# Patient Record
Sex: Female | Born: 1965 | Race: White | Hispanic: No | Smoking: Current every day smoker
Health system: Southern US, Community
[De-identification: ages and names within clinical notes are randomized; demographics above are authoritative.]

## PROBLEM LIST (undated history)

## (undated) DIAGNOSIS — T8859XA Other complications of anesthesia, initial encounter: Secondary | ICD-10-CM

## (undated) DIAGNOSIS — I10 Essential (primary) hypertension: Secondary | ICD-10-CM

## (undated) DIAGNOSIS — M1990 Unspecified osteoarthritis, unspecified site: Secondary | ICD-10-CM

## (undated) DIAGNOSIS — F909 Attention-deficit hyperactivity disorder, unspecified type: Secondary | ICD-10-CM

## (undated) DIAGNOSIS — T7840XA Allergy, unspecified, initial encounter: Secondary | ICD-10-CM

## (undated) DIAGNOSIS — R61 Generalized hyperhidrosis: Secondary | ICD-10-CM

## (undated) DIAGNOSIS — F439 Reaction to severe stress, unspecified: Secondary | ICD-10-CM

## (undated) DIAGNOSIS — F32A Depression, unspecified: Secondary | ICD-10-CM

## (undated) DIAGNOSIS — J189 Pneumonia, unspecified organism: Secondary | ICD-10-CM

## (undated) DIAGNOSIS — J40 Bronchitis, not specified as acute or chronic: Secondary | ICD-10-CM

## (undated) DIAGNOSIS — R0600 Dyspnea, unspecified: Secondary | ICD-10-CM

## (undated) DIAGNOSIS — J449 Chronic obstructive pulmonary disease, unspecified: Secondary | ICD-10-CM

## (undated) DIAGNOSIS — M549 Dorsalgia, unspecified: Secondary | ICD-10-CM

## (undated) DIAGNOSIS — G8929 Other chronic pain: Secondary | ICD-10-CM

## (undated) DIAGNOSIS — K219 Gastro-esophageal reflux disease without esophagitis: Secondary | ICD-10-CM

## (undated) DIAGNOSIS — F419 Anxiety disorder, unspecified: Secondary | ICD-10-CM

## (undated) HISTORY — DX: Allergy, unspecified, initial encounter: T78.40XA

## (undated) HISTORY — DX: Unspecified osteoarthritis, unspecified site: M19.90

## (undated) HISTORY — DX: Gastro-esophageal reflux disease without esophagitis: K21.9

## (undated) HISTORY — DX: Essential (primary) hypertension: I10

## (undated) HISTORY — DX: Pneumonia, unspecified organism: J18.9

## (undated) HISTORY — DX: Bronchitis, not specified as acute or chronic: J40

## (undated) HISTORY — DX: Reaction to severe stress, unspecified: F43.9

## (undated) HISTORY — DX: Generalized hyperhidrosis: R61

## (undated) HISTORY — PX: APPENDECTOMY: SHX54

## (undated) HISTORY — PX: ABDOMINAL HYSTERECTOMY: SHX81

---

## 1991-09-24 HISTORY — PX: OTHER SURGICAL HISTORY: SHX169

## 1998-03-01 ENCOUNTER — Ambulatory Visit (HOSPITAL_COMMUNITY): Admission: RE | Admit: 1998-03-01 | Discharge: 1998-03-01 | Payer: Self-pay | Admitting: Neurosurgery

## 1998-03-15 ENCOUNTER — Ambulatory Visit (HOSPITAL_COMMUNITY): Admission: RE | Admit: 1998-03-15 | Discharge: 1998-03-15 | Payer: Self-pay | Admitting: Neurosurgery

## 1998-03-29 ENCOUNTER — Ambulatory Visit (HOSPITAL_COMMUNITY): Admission: RE | Admit: 1998-03-29 | Discharge: 1998-03-29 | Payer: Self-pay | Admitting: Neurosurgery

## 2002-10-11 ENCOUNTER — Ambulatory Visit (HOSPITAL_COMMUNITY): Admission: RE | Admit: 2002-10-11 | Discharge: 2002-10-11 | Payer: Self-pay | Admitting: Family Medicine

## 2002-10-11 ENCOUNTER — Encounter: Payer: Self-pay | Admitting: Family Medicine

## 2003-07-03 ENCOUNTER — Emergency Department (HOSPITAL_COMMUNITY): Admission: EM | Admit: 2003-07-03 | Discharge: 2003-07-03 | Payer: Self-pay | Admitting: Emergency Medicine

## 2006-09-03 ENCOUNTER — Ambulatory Visit (HOSPITAL_COMMUNITY): Payer: Self-pay | Admitting: Psychology

## 2006-09-10 ENCOUNTER — Ambulatory Visit (HOSPITAL_COMMUNITY): Payer: Self-pay | Admitting: Psychology

## 2006-10-08 ENCOUNTER — Ambulatory Visit (HOSPITAL_COMMUNITY): Payer: Self-pay | Admitting: Psychology

## 2006-10-29 ENCOUNTER — Ambulatory Visit (HOSPITAL_COMMUNITY): Payer: Self-pay | Admitting: Psychology

## 2006-11-12 ENCOUNTER — Ambulatory Visit (HOSPITAL_COMMUNITY): Payer: Self-pay | Admitting: Psychology

## 2007-03-18 ENCOUNTER — Ambulatory Visit: Payer: Self-pay | Admitting: Cardiology

## 2007-04-14 ENCOUNTER — Ambulatory Visit (HOSPITAL_COMMUNITY): Payer: Self-pay | Admitting: Psychiatry

## 2007-05-05 ENCOUNTER — Ambulatory Visit (HOSPITAL_COMMUNITY): Payer: Self-pay | Admitting: Psychiatry

## 2007-07-30 ENCOUNTER — Ambulatory Visit (HOSPITAL_COMMUNITY): Payer: Self-pay | Admitting: Psychiatry

## 2007-09-14 ENCOUNTER — Other Ambulatory Visit: Admission: RE | Admit: 2007-09-14 | Discharge: 2007-09-14 | Payer: Self-pay | Admitting: Obstetrics and Gynecology

## 2007-09-29 ENCOUNTER — Ambulatory Visit (HOSPITAL_COMMUNITY): Payer: Self-pay | Admitting: Psychiatry

## 2007-11-26 ENCOUNTER — Ambulatory Visit (HOSPITAL_COMMUNITY): Payer: Self-pay | Admitting: Psychiatry

## 2008-05-11 ENCOUNTER — Encounter: Payer: Self-pay | Admitting: Obstetrics & Gynecology

## 2008-05-11 ENCOUNTER — Ambulatory Visit (HOSPITAL_COMMUNITY): Admission: RE | Admit: 2008-05-11 | Discharge: 2008-05-12 | Payer: Self-pay | Admitting: Obstetrics & Gynecology

## 2009-08-30 ENCOUNTER — Ambulatory Visit (HOSPITAL_COMMUNITY): Admission: RE | Admit: 2009-08-30 | Discharge: 2009-08-30 | Payer: Self-pay | Admitting: Obstetrics & Gynecology

## 2010-12-15 ENCOUNTER — Emergency Department (HOSPITAL_COMMUNITY): Payer: 59

## 2010-12-15 ENCOUNTER — Other Ambulatory Visit: Payer: Self-pay | Admitting: General Surgery

## 2010-12-15 ENCOUNTER — Observation Stay (HOSPITAL_COMMUNITY)
Admission: EM | Admit: 2010-12-15 | Discharge: 2010-12-16 | Disposition: A | Discharge status: Home / Self Care | Payer: 59 | Attending: General Surgery | Admitting: General Surgery

## 2010-12-15 DIAGNOSIS — R1031 Right lower quadrant pain: Secondary | ICD-10-CM | POA: Insufficient documentation

## 2010-12-15 DIAGNOSIS — K3580 Unspecified acute appendicitis: Principal | ICD-10-CM | POA: Insufficient documentation

## 2010-12-15 DIAGNOSIS — Z01812 Encounter for preprocedural laboratory examination: Secondary | ICD-10-CM | POA: Insufficient documentation

## 2010-12-15 DIAGNOSIS — F411 Generalized anxiety disorder: Secondary | ICD-10-CM | POA: Insufficient documentation

## 2010-12-15 DIAGNOSIS — F988 Other specified behavioral and emotional disorders with onset usually occurring in childhood and adolescence: Secondary | ICD-10-CM | POA: Insufficient documentation

## 2010-12-15 LAB — DIFFERENTIAL
Basophils Absolute: 0 10*3/uL (ref 0.0–0.1)
Basophils Relative: 0 % (ref 0–1)
Eosinophils Absolute: 0.1 10*3/uL (ref 0.0–0.7)
Eosinophils Relative: 1 % (ref 0–5)
Lymphocytes Relative: 27 % (ref 12–46)
Lymphs Abs: 2.1 10*3/uL (ref 0.7–4.0)
Monocytes Absolute: 0.4 10*3/uL (ref 0.1–1.0)
Monocytes Relative: 5 % (ref 3–12)
Neutro Abs: 5.1 10*3/uL (ref 1.7–7.7)
Neutrophils Relative %: 66 % (ref 43–77)

## 2010-12-15 LAB — CBC
HCT: 39.2 % (ref 36.0–46.0)
Hemoglobin: 12.9 g/dL (ref 12.0–15.0)
MCH: 30.6 pg (ref 26.0–34.0)
MCHC: 32.9 g/dL (ref 30.0–36.0)
MCV: 93.1 fL (ref 78.0–100.0)
Platelets: 150 10*3/uL (ref 150–400)
RBC: 4.21 MIL/uL (ref 3.87–5.11)
RDW: 12.9 % (ref 11.5–15.5)
WBC: 7.6 10*3/uL (ref 4.0–10.5)

## 2010-12-15 LAB — URINE MICROSCOPIC-ADD ON

## 2010-12-15 LAB — BASIC METABOLIC PANEL
BUN: 7 mg/dL (ref 6–23)
CO2: 23 mEq/L (ref 19–32)
Calcium: 9.8 mg/dL (ref 8.4–10.5)
Chloride: 106 mEq/L (ref 96–112)
Creatinine, Ser: 0.54 mg/dL (ref 0.4–1.2)
GFR calc Af Amer: >60 mL/min (ref >60)
GFR calc non Af Amer: >60 mL/min (ref >60)
Glucose, Bld: 86 mg/dL (ref 70–99)
Potassium: 3.5 mEq/L (ref 3.5–5.1)
Sodium: 141 mEq/L (ref 135–145)

## 2010-12-15 LAB — URINALYSIS, ROUTINE W REFLEX MICROSCOPIC
Glucose, UA: NEGATIVE mg/dL
Leukocytes, UA: NEGATIVE
Nitrite: POSITIVE — AB
Specific Gravity, Urine: 1.016 (ref 1.005–1.030)
pH: 7.5 (ref 5.0–8.0)

## 2010-12-15 LAB — LIPASE, BLOOD: Lipase: 33 U/L (ref 11–59)

## 2010-12-15 MED ORDER — IOHEXOL 300 MG/ML  SOLN
100.0000 mL | Freq: Once | INTRAMUSCULAR | Status: AC | PRN
  Administered 2010-12-15: 100 mL via INTRAVENOUS

## 2010-12-18 LAB — URINE CULTURE

## 2010-12-20 NOTE — H&P (Signed)
  NAMEALLANA, Alicia Fritz            ACCOUNT NO.:  [id number]  MEDICAL RECORD NO.:  [id number]           PATIENT TYPE:  O  LOCATION:  5127                         FACILITY:  [facility]  PHYSICIAN:  Gabrielle Dare. Janee Morn, M.D.DATE OF BIRTH:  [birth year]  DATE OF ADMISSION:  12/15/2010 DATE OF DISCHARGE:                             HISTORY & PHYSICAL   CHIEF COMPLAINT:  Right lower quadrant abdominal pain and nausea.  HISTORY OF PRESENT ILLNESS:  Ms. Alicia Fritz is a 45 year old white female, who developed acute onset of nausea around noon time yesterday, which quickly developed into right lower quadrant abdominal pain, but has persisted since then and it has been getting a little bit worse.  She came to the emergency department for further evaluation.  PAST MEDICAL HISTORY:  Includes anxiety disorder and hypertension.  PAST SURGICAL HISTORY:  Includes vaginal hysterectomy and foot surgery.  SOCIAL HISTORY:  She currently smokes cigarettes.  She does not drink alcohol.  She works as a Interior and spatial designer.  ALLERGIES:  PENICILLIN causing anxiety and hallucinations and SULFA.  MEDICATIONS:  Include: 1. Adderall 30 mg daily. 2. Cymbalta 60 mg daily. 3. Diovan 80 mg daily. 4. Xanax 0.5 mg daily. 5. Vicodin as needed.  REVIEW OF SYSTEMS:  GI:  Complaints as above.  NEUROPSYCH:  She has ADD and some anxiety disorder.  CARDIOVASCULAR:  She has some intermittent lower extremity swelling.  Remainder of the system review was unrevealing.  PHYSICAL EXAMINATION:  VITAL SIGNS:  Temperature 98.2, pulse 76, respirations 16, blood pressure 138/79. GENERAL:  She is awake and alert. HEENT:  She is normocephalic and atraumatic. NECK:  Supple with no tenderness. LUNGS:  Clear to auscultation. HEART:  Regular with no murmurs.  Impulses palpable in the left chest. Distal pulses are 2+.  She does have some mild peripheral edema in the lower extremities. ABDOMEN:  Soft but tender in the right lower quadrant with no  guarding. Bowel sounds are hypoactive.  No masses are felt.  No organomegaly is noted. EXTREMITIES:  As above. SKIN:  Warm and dry.  LABORATORY STUDIES:  White blood cell count 7600, hemoglobin 12.9. Basic metabolic panel is within normal limits.  CT scan of the abdomen and pelvis is consistent with early acute appendicitis without evidence of perforation.  IMPRESSION:  Acute appendicitis.  PLAN:  We will take her to the operating room for laparoscopic appendectomy.  Procedure, risk and benefits were discussed in detail with the patient and her husband.  Questions were answered, and she is agreeable.  We will give her IV antibiotics at this time.     Gabrielle Dare Janee Morn, M.D.     BET/MEDQ  D:  12/15/2010  T:  12/16/2010  Job:  [id number]  Electronically Signed by Violeta Gelinas M.D. on 12/20/2010 01:42:01 PM

## 2010-12-20 NOTE — Op Note (Signed)
NAMEJAMECA, Alicia Fritz            ACCOUNT NO.:  [id number]  MEDICAL RECORD NO.:  [id number]           PATIENT TYPE:  O  LOCATION:  5127                         FACILITY:  [facility]  PHYSICIAN:  Gabrielle Dare. Janee Morn, M.D.DATE OF BIRTH:  [birth year]  DATE OF PROCEDURE:  12/15/2010 DATE OF DISCHARGE:                              OPERATIVE REPORT   PREOPERATIVE DIAGNOSIS:  Acute appendicitis.  POSTOPERATIVE DIAGNOSIS:  Acute appendicitis.  PROCEDURE:  Laparoscopic appendectomy.  SURGEON:  Gabrielle Dare. Janee Morn, MD  ANESTHESIA:  General endotracheal.  HISTORY OF PRESENT ILLNESS:  Alicia Fritz is a 45 year old female who presented to the emergency department with right lower quadrant abdominal pain and nausea.  History, physical exam and workup are all consistent with acute appendicitis.  She is brought to operating room for laparoscopic appendectomy.  PROCEDURE IN DETAIL:  Informed consent was obtained.  The patient received intravenous antibiotics on call to the operating room.  She was identified in the preop holding area.  She was brought to the operating room.  General endotracheal anesthesia was administered by the anesthesia staff.  Her abdomen was prepped and draped in a sterile fashion after placement of Foley catheter by nursing staff.  There was a time-out procedure done.  Infraumbilical region was infiltrated with 0.25% Marcaine with epinephrine.  Infraumbilical incision was made. Subcutaneous tissues were dissected down revealing the anterior fascia. This was divided sharply along the midline and the peritoneal cavities entered under direct vision without difficulty.  A 0 Vicryl purse-string suture was placed on the fascial opening.  The Hasson trocar was then inserted into the abdomen and the abdomen was insufflated with carbon dioxide in standard fashion.  Under direct vision, a 12-mm left lower quadrant and a 5-mm right midabdomen port were placed.  Local anesthetic was used  at both port sites as well.  Laparoscopic exploration revealed inflamed but not perforated appendix.  Mesoappendix was divided with the Harmonic scalpel achieving excellent hemostasis.  This cleared things off down to the base which was not acutely inflamed.  Base of the appendix was then divided with an endoscopic GIA stapler with a vascular load.  This left excellent staple line.  Appendix was placed in an EndoCatch bag and removed from the abdomen via the left lower quadrant port site.  The abdomen was copiously irrigated.  The irrigation fluid returned clear.  There was no bleeding.  The staple line was intact on the cecum.  The remainder of the irrigation fluid was evacuated and the ports were removed under direct vision.  Pneumoperitoneum was released. The infraumbilical fascia was closed by tying the 0 Vicryl purse-string suture with care not to trap the intraabdominal content.  All three wounds were copiously irrigated and the skin of each was closed with running 4-0 Vicryl subcuticular stitch followed by Dermabond.  Sponge, needle and instrument counts were correct.  The patient tolerated the procedure well without apparent complications and was taken to the recovery room in stable condition.     Gabrielle Dare Janee Morn, M.D.     BET/MEDQ  D:  12/15/2010  T:  12/16/2010  Job:  [id number]  Electronically Signed by  Violeta Gelinas M.D. on 12/20/2010 01:42:05 PM

## 2011-01-03 NOTE — Discharge Summary (Signed)
  [name], Fritz            ACCOUNT NO.:  [id number]  MEDICAL RECORD NO.:  [id number]           PATIENT TYPE:  O  LOCATION:  5127                         FACILITY:  [facility]  PHYSICIAN:  Gabrielle Dare. Janee Morn, M.D.DATE OF BIRTH:  June 30, 1966  DATE OF ADMISSION:  12/15/2010 DATE OF DISCHARGE:  12/16/2010                              DISCHARGE SUMMARY   ADMISSION DIAGNOSES: 1. Acute appendicitis. 2. Hypertension. 3. Anxiety disorder. 4. Tobacco use.  DISCHARGE DIAGNOSES: 1. Acute appendicitis. 2. Hypertension. 3. Anxiety disorder. 4. Tobacco use.  PROCEDURE:  Laparoscopic appendectomy, December 15, 2010.  BRIEF HISTORY:  The patient is a 44-year white female who developed acute nausea around noon yesterday and then developed into right lower quadrant abdominal pain.  This had been persisted and become aggressively worse.  She was seen in the emergency room where white count was normal at 6000.  CT of the abdomen and pelvis was consistent with early acute appendicitis without evidence of perforation.  She was seen in consultation by Dr. Violeta Gelinas and admitted, taken directly to the OR for laparoscopic appendectomy.  PAST MEDICAL HISTORY:  Includes anxiety disorder and hypertension.  PAST SURGICAL HISTORY:  Includes vaginal hysterectomy and foot surgery.  She is currently a tobacco smoker.  ALLERGIES:  PENICILLIN causes anxiety and hallucinations, similar reaction to SULFA.  MEDICATIONS: 1. Adderall 30 mg daily. 2. Cymbalta 60 mg daily. 3. Diovan 80 mg daily. 4. Xanax 0.5 daily. 5. Vicodin p.r.n.  For further history and physical, please see the history and physical.  HOSPITAL COURSE:  The patient was admitted, taken to the OR, and underwent laparoscopic appendectomy.  She was returned to the floor in satisfactory condition.  The following morning, she was taking p.o.'s without difficulty.  She ambulated, positive for flatus, positive for bowel sounds, wound  looked good, and the patient was anxious for discharge.  She was subsequently discharged home that morning on her home meds and Percocet 1-2 p.o. q.4 hours p.r.n.  She was scheduled for followup on January 08, 2011, at 1:30 p.m. in the [facility]  CONDITION ON DISCHARGE:  Improved.     Eber Hong, P.A.   ______________________________ Gabrielle Dare. Janee Morn, M.D.    WDJ/MEDQ  D:  12/22/2010  T:  12/23/2010  Job:  [id number]  Electronically Signed by Sherrie George P.A. on 12/31/2010 81:19:14 PM Electronically Signed by Violeta Gelinas M.D. on 01/03/2011 04:14:29 PM

## 2011-02-05 NOTE — Op Note (Signed)
[name], Alicia Fritz            ACCOUNT NO.:  [id number]   MEDICAL RECORD NO.:  [id number]          PATIENT TYPE:  AMB   LOCATION:  DAY                           FACILITY:  [facility]   PHYSICIAN:  Lazaro Arms, M.D.   DATE OF BIRTH:  1966/05/05   DATE OF PROCEDURE:  05/11/2008  DATE OF DISCHARGE:                               OPERATIVE REPORT   PREOPERATIVE DIAGNOSES:  1. Menometrorrhagia.  2. Dysmenorrhea.  3. Midline pelvic pain.  4. Dyspareunia.   POSTOPERATIVE DIAGNOSES:  1. Menometrorrhagia.  2. Dysmenorrhea.  3. Midline pelvic pain.  4. Dyspareunia.   PROCEDURE:  Vaginal hysterectomy.   SURGEON:  Lazaro Arms, MD   ANESTHESIA:  General endotracheal.   FINDINGS:  The patient had a normal uterus, tubes, and ovaries.  There  were no pelvic abnormalities that I could see.   DESCRIPTION OF OPERATION:  The patient was taken to the operating room,  placed in supine position where she underwent general endotracheal  anesthesia  She was then placed in dorsal lithotomy position in candy  cane stirrups, and prepped and draped in usual sterile fashion. A  weighted speculum was placed. Her cervix was grasped with thyroid  tenaculum.  A 0.5% Marcaine with 1:20,000 epinephrine was injected in  the cervix in a circumferential fashion.  Electrocautery unit was used  and the vagina was incised.  The anterior and posterior vagina were  pushed off the lower uterine segment.  The posterior cul-de-sac was  entered.  Uterosacral ligaments were clamped, cut, and suture-ligated.  The cardinal ligaments were clamped, cut, and suture-ligated.  The  anterior cul-de-sac was entered.  The uterine vessels were clamped  anteriorly and posteriorly . The broad ligaments were plicated and  clamped cut and suture-ligated.  Serial pedicles were taken up the  fundus.  Each pedicle being clamped, cut, and suture-ligated.  The utero-  ovarian ligaments were cross-clamped.  Suture ligation was  performed  bilaterally with good hemostasis.  Peritoneum was closed.  The vagina was closed side-to-side with  interrupted sutures.  The patient tolerated the procedure well.  She  experienced 100 mL of blood loss and taken to recovery room in good  condition.  All counts were correct x3.      Lazaro Arms, M.D.  Electronically Signed     LHE/MEDQ  D:  05/11/2008  T:  05/12/2008  Job:  [id number]

## 2011-02-28 ENCOUNTER — Encounter (INDEPENDENT_AMBULATORY_CARE_PROVIDER_SITE_OTHER): Payer: Self-pay | Admitting: General Surgery

## 2011-06-21 LAB — BASIC METABOLIC PANEL
CO2: 22
Calcium: 9.5
GFR calc Af Amer: >60
GFR calc non Af Amer: >60
Glucose, Bld: 119 — ABNORMAL HIGH
Potassium: 3.7
Sodium: 137

## 2011-06-21 LAB — CBC
HCT: 36.9
Hemoglobin: 12.4
WBC: 6.1

## 2011-06-21 LAB — URINALYSIS, ROUTINE W REFLEX MICROSCOPIC
Bilirubin Urine: NEGATIVE
Ketones, ur: NEGATIVE
Nitrite: NEGATIVE
Specific Gravity, Urine: 1.025
Urobilinogen, UA: 0.2
pH: 6

## 2011-06-21 LAB — TYPE AND SCREEN: Antibody Screen: NEGATIVE

## 2012-04-20 IMAGING — CT CT ABD-PELV W/ CM
2 of 6 series · 17 of 46 positions shown, 19 images · IV contrast (CONTRAST)
Comparison: None.

CLINICAL DATA: Right lower quadrant pain, nausea, chills.

CT ABDOMEN AND PELVIS WITH CONTRAST
TECHNIQUE: Multidetector CT imaging of the abdomen and pelvis was
performed following the standard protocol during bolus
administration of intravenous contrast.
Contrast: 100 ml Omnipaque 300 IV.

[Series 2: routine · axial · 0.69mm/px · z∈[-462,-32]mm · 14 of 98 slices shown, 16 images]
[im 6/98  soft-tissue]
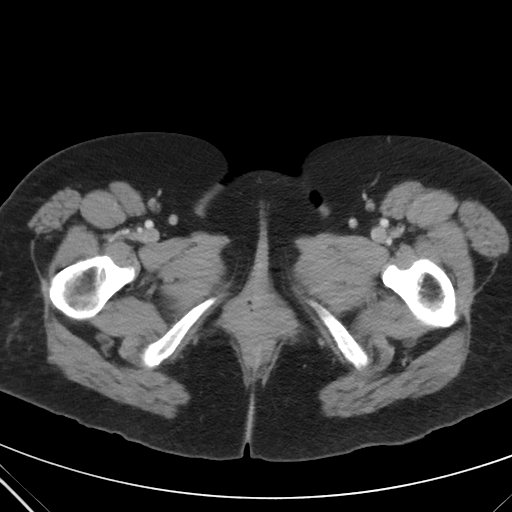
[im 6/98  bone]
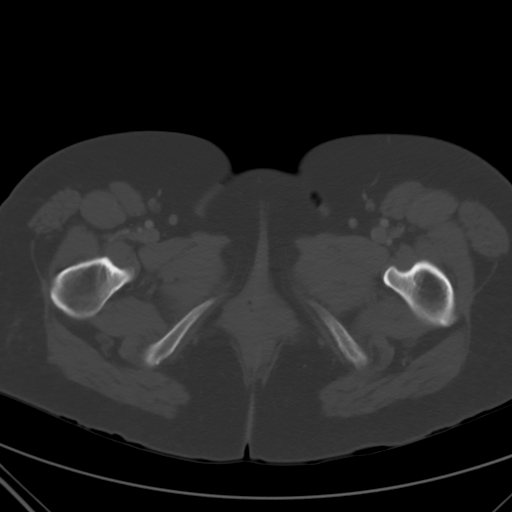
[im 12/98  soft-tissue]
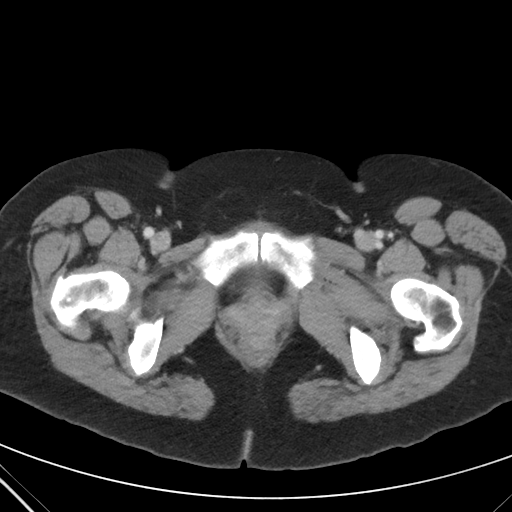
[im 18/98  soft-tissue]
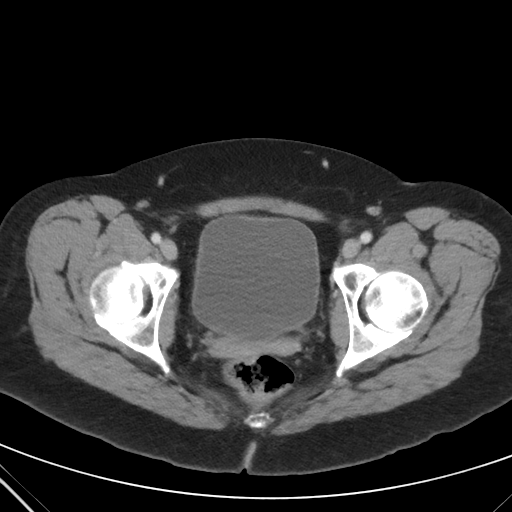
[im 29/98  soft-tissue]
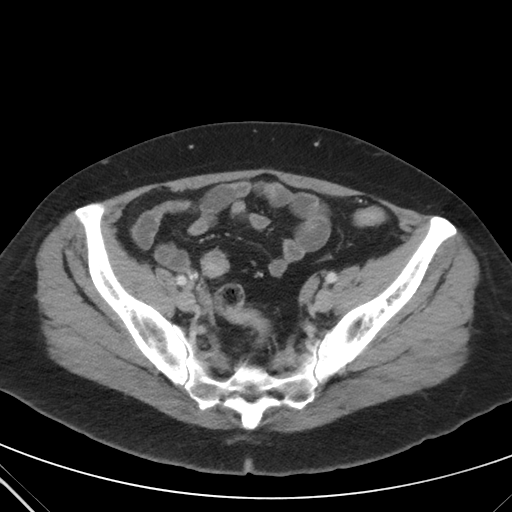
[im 35/98  soft-tissue]
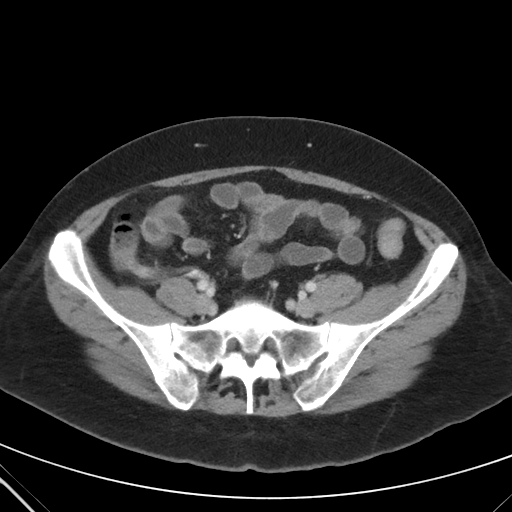
[im 40/98  soft-tissue]
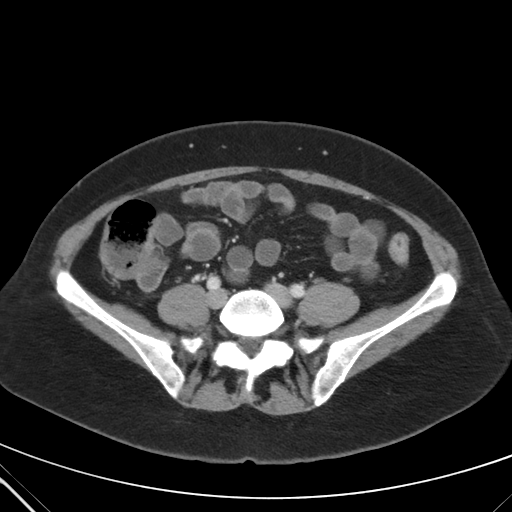
[im 46/98  soft-tissue]
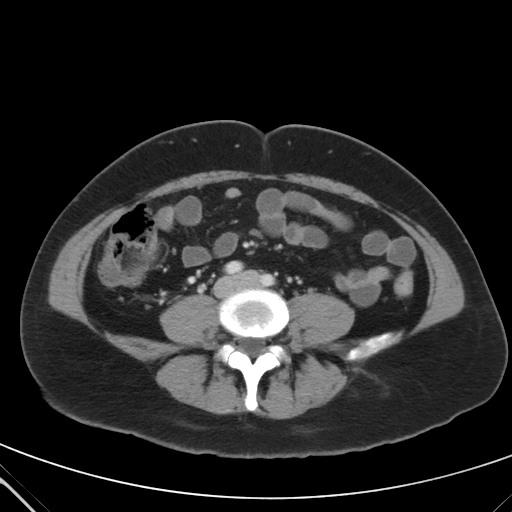
[im 52/98  soft-tissue]
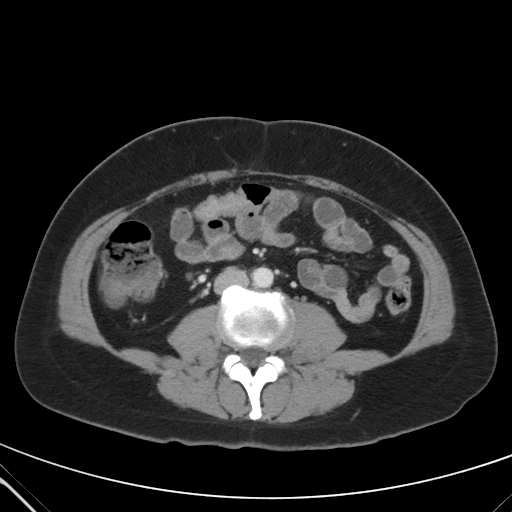
[im 58/98  soft-tissue]
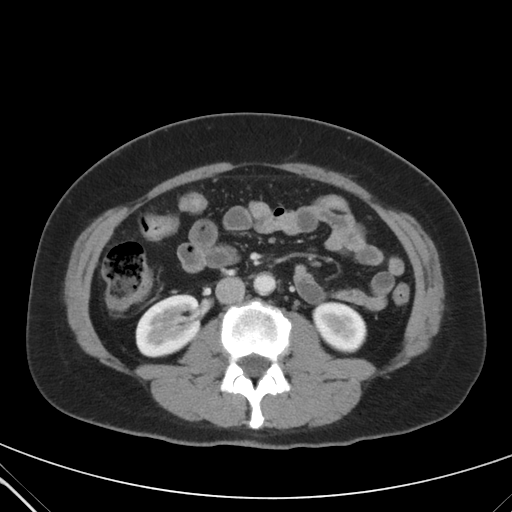
[im 58/98  bone]
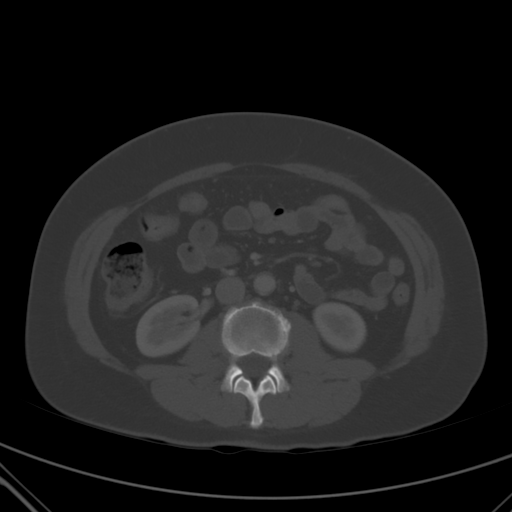
[im 63/98  soft-tissue]
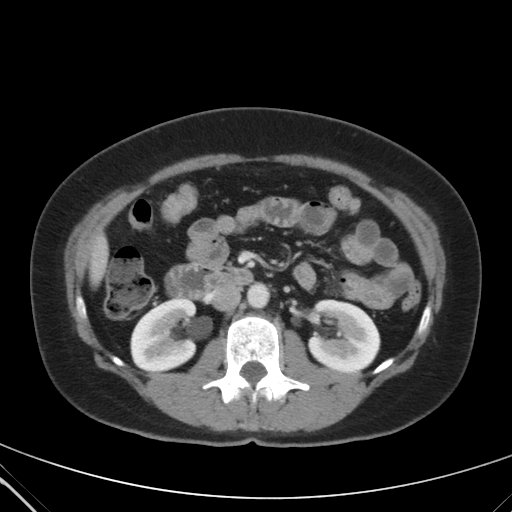
[im 75/98  soft-tissue]
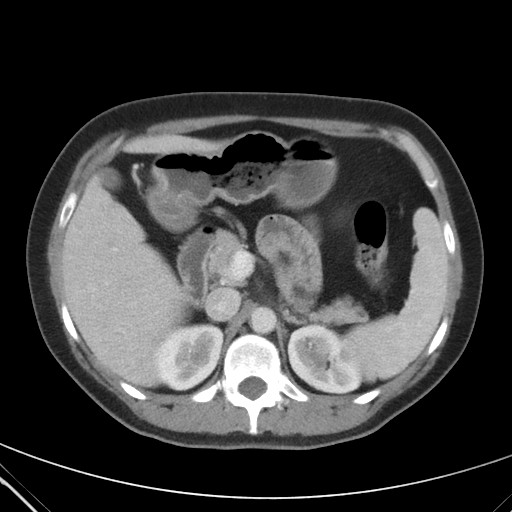
[im 80/98  soft-tissue]
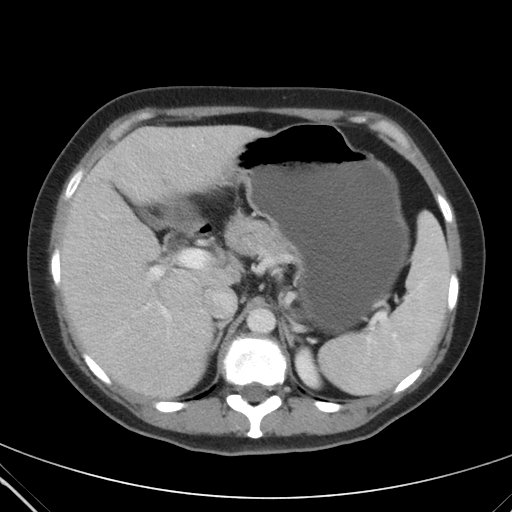
[im 86/98  soft-tissue]
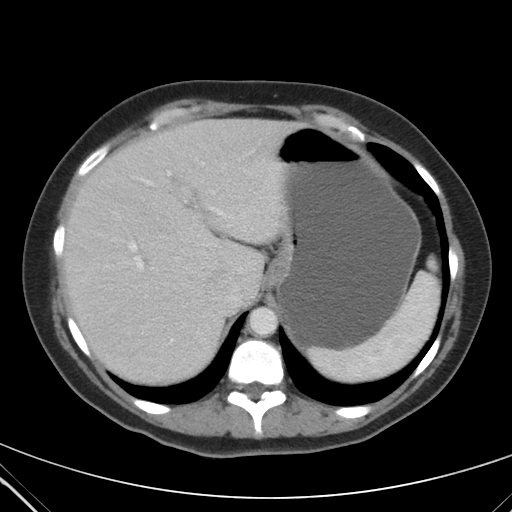
[im 92/98  soft-tissue]
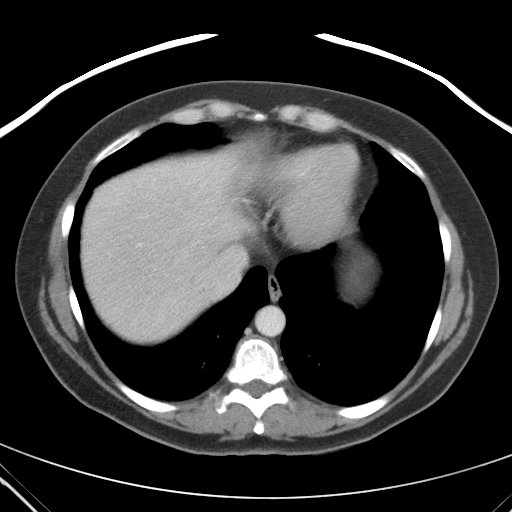

[cor · coronal · 0.95mm/px · 3 of 123 slices shown]
[im 41/123  soft-tissue]
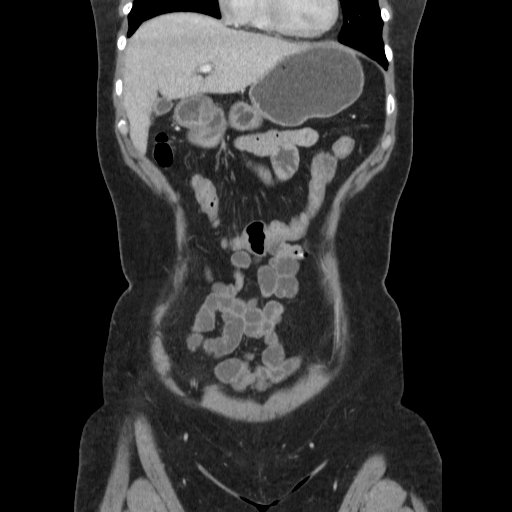
[im 55/123  soft-tissue]
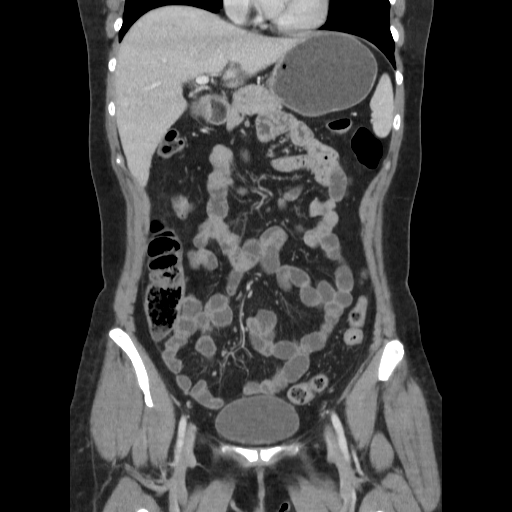
[im 68/123  soft-tissue]
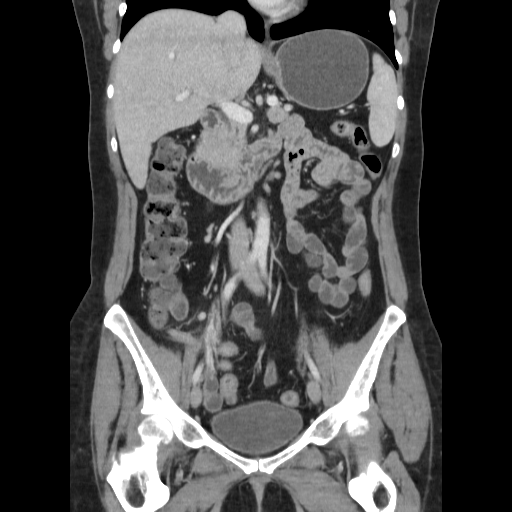

[17 of 46 positions shown; findings below may reference images not displayed]

FINDINGS: Minimal dependent atelectasis in the lung bases.  No
effusions.  Heart is normal size.

Liver, spleen, pancreas, adrenals and kidneys are normal.
Gallbladder is contracted.

Appendix is mildly dilated, measuring up to 9 mm.  Slight
surrounding inflammatory change.  Findings compatible with early
appendicitis.

Large and small bowel are unremarkable.  No free fluid, free air or
adenopathy.  Prior hysterectomy.  No adnexal masses.  Urinary
bladder is unremarkable.  No acute bony abnormality.
IMPRESSION: Early acute appendicitis changes.

## 2013-02-26 ENCOUNTER — Encounter: Payer: Self-pay | Admitting: *Deleted

## 2013-03-01 ENCOUNTER — Ambulatory Visit (INDEPENDENT_AMBULATORY_CARE_PROVIDER_SITE_OTHER): Payer: 59 | Admitting: Obstetrics & Gynecology

## 2013-03-01 ENCOUNTER — Encounter: Payer: Self-pay | Admitting: Obstetrics & Gynecology

## 2013-03-01 VITALS — BP 130/70 | Ht 65.0 in | Wt 147.0 lb

## 2013-03-01 DIAGNOSIS — Z01419 Encounter for gynecological examination (general) (routine) without abnormal findings: Secondary | ICD-10-CM

## 2013-03-01 NOTE — Progress Notes (Signed)
Patient ID: Alicia Fritz, female   DOB: 08-Apr-1966, 47 y.o.   MRN: [id number] Subjective:     Alicia Fritz is a 47 y.o. female here for a routine exam.  No LMP recorded. Status post hysterectomy.  No obstetric history on file. Current complaints: none.  Personal health questionnaire reviewed: no.   Gynecologic History No LMP recorded. Contraception: status post hysterectomy Last Pap: na. Results were: na Last mammogram: 2010. Results were: normal  Obstetric History OB History   Grav Para Term Preterm Abortions TAB SAB Ect Mult Living                   The following portions of the patient's history were reviewed and updated as appropriate: allergies, current medications, past family history, past medical history, past social history, past surgical history and problem list.  Review of Systems  Review of Systems  Constitutional: Negative for fever, chills, weight loss, malaise/fatigue and diaphoresis.  HENT: Negative for hearing loss, ear pain, nosebleeds, congestion, sore throat, neck pain, tinnitus and ear discharge.   Eyes: Negative for blurred vision, double vision, photophobia, pain, discharge and redness.  Respiratory: Negative for cough, hemoptysis, sputum production, shortness of breath, wheezing and stridor.   Cardiovascular: Negative for chest pain, palpitations, orthopnea, claudication, leg swelling and PND.  Gastrointestinal: negative for abdominal pain. Negative for heartburn, nausea, vomiting, diarrhea, constipation, blood in stool and melena.  Genitourinary: Negative for dysuria, urgency, frequency, hematuria and flank pain.  Musculoskeletal: Negative for myalgias, back pain, joint pain and falls.  Skin: Negative for itching and rash.  Neurological: Negative for dizziness, tingling, tremors, sensory change, speech change, focal weakness, seizures, loss of consciousness, weakness and headaches.  Endo/Heme/Allergies: Negative for environmental allergies and  polydipsia. Does not bruise/bleed easily.  Psychiatric/Behavioral: Negative for depression, suicidal ideas, hallucinations, memory loss and substance abuse. The patient is not nervous/anxious and does not have insomnia.        Objective:    Physical Exam  Vitals reviewed. Constitutional: She is oriented to person, place, and time. She appears well-developed and well-nourished.  HENT:  Head: Normocephalic and atraumatic.        Right Ear: External ear normal.  Left Ear: External ear normal.  Nose: Nose normal.  Mouth/Throat: Oropharynx is clear and moist.  Eyes: Conjunctivae and EOM are normal. Pupils are equal, round, and reactive to light. Right eye exhibits no discharge. Left eye exhibits no discharge. No scleral icterus.  Neck: Normal range of motion. Neck supple. No tracheal deviation present. No thyromegaly present.  Cardiovascular: Normal rate, regular rhythm, normal heart sounds and intact distal pulses.  Exam reveals no gallop and no friction rub.   No murmur heard. Respiratory: Effort normal and breath sounds normal. No respiratory distress. She has no wheezes. She has no rales. She exhibits no tenderness.  GI: Soft. Bowel sounds are normal. She exhibits no distension and no mass. There is no tenderness. There is no rebound and no guarding.  Genitourinary:       Vulva is normal without lesions Vagina is pink moist without discharge Cervix surgically absent Uterus surgically absent Adnexa is negative with normal sized ovaries   Musculoskeletal: Normal range of motion. She exhibits no edema and no tenderness.  Neurological: She is alert and oriented to person, place, and time. She has normal reflexes. She displays normal reflexes. No cranial nerve deficit. She exhibits normal muscle tone. Coordination normal.  Skin: Skin is warm and dry. No rash noted. No erythema. No  pallor.  Psychiatric: She has a normal mood and affect. Her behavior is normal. Judgment and thought content  normal.       Assessment:    Healthy female exam.    Plan:    Mammogram ordered. Follow up in: 1 year.

## 2013-11-23 ENCOUNTER — Encounter (INDEPENDENT_AMBULATORY_CARE_PROVIDER_SITE_OTHER): Payer: Self-pay | Admitting: *Deleted

## 2013-11-24 ENCOUNTER — Encounter (INDEPENDENT_AMBULATORY_CARE_PROVIDER_SITE_OTHER): Payer: Self-pay

## 2013-12-23 ENCOUNTER — Telehealth (INDEPENDENT_AMBULATORY_CARE_PROVIDER_SITE_OTHER): Payer: Self-pay | Admitting: *Deleted

## 2013-12-23 ENCOUNTER — Other Ambulatory Visit (INDEPENDENT_AMBULATORY_CARE_PROVIDER_SITE_OTHER): Payer: Self-pay | Admitting: *Deleted

## 2013-12-23 DIAGNOSIS — Z800 Family history of malignant neoplasm of digestive organs: Secondary | ICD-10-CM

## 2013-12-23 DIAGNOSIS — Z1211 Encounter for screening for malignant neoplasm of colon: Secondary | ICD-10-CM

## 2013-12-23 NOTE — Telephone Encounter (Signed)
Patient needs movi prep 

## 2013-12-27 MED ORDER — PEG-KCL-NACL-NASULF-NA ASC-C 100 G PO SOLR: 1.0000 | Freq: Once | ORAL | Status: DC

## 2014-02-08 ENCOUNTER — Telehealth (INDEPENDENT_AMBULATORY_CARE_PROVIDER_SITE_OTHER): Payer: Self-pay | Admitting: *Deleted

## 2014-02-08 NOTE — Telephone Encounter (Signed)
  Procedure: tcs  Reason/Indication:  fam hx colon ca  Has patient had this procedure before?  Yes, 2010 -- scanned  If so, when, by whom and where?    Is there a family history of colon cancer?  Yes, father & mother  Who?  What age when diagnosed?    Is patient diabetic?   no      Does patient have prosthetic heart valve?  no  Do you have a pacemaker?  no  Has patient ever had endocarditis? no  Has patient had joint replacement within last 12 months?  no  Does patient tend to be constipated or take laxatives? no  Is patient on Coumadin, Plavix and/or Aspirin? no  Medications: ibuprofen prn, diovan 160 mg daily, cymbalta 60 mg daily, adderall 30 mg daily, xanax 0.5 mg daily, lortab 5/325 mg daily  Allergies: sulfur, pcn  Medication Adjustment:   Procedure date & time: 03/02/14 at 930

## 2014-02-08 NOTE — Telephone Encounter (Signed)
agree

## 2014-02-09 ENCOUNTER — Encounter (HOSPITAL_COMMUNITY): Payer: Self-pay | Admitting: Pharmacy Technician

## 2014-03-02 ENCOUNTER — Encounter (HOSPITAL_COMMUNITY): Payer: Self-pay | Admitting: *Deleted

## 2014-03-02 ENCOUNTER — Encounter (HOSPITAL_COMMUNITY)
Admission: RE | Disposition: A | Discharge status: Home / Self Care | Payer: Self-pay | Source: Ambulatory Visit | Attending: Internal Medicine

## 2014-03-02 ENCOUNTER — Ambulatory Visit (HOSPITAL_COMMUNITY)
Admission: RE | Admit: 2014-03-02 | Discharge: 2014-03-02 | Disposition: A | Discharge status: Home / Self Care | Payer: 59 | Source: Ambulatory Visit | Attending: Internal Medicine | Admitting: Internal Medicine

## 2014-03-02 DIAGNOSIS — Z1211 Encounter for screening for malignant neoplasm of colon: Secondary | ICD-10-CM

## 2014-03-02 DIAGNOSIS — Z800 Family history of malignant neoplasm of digestive organs: Secondary | ICD-10-CM | POA: Insufficient documentation

## 2014-03-02 DIAGNOSIS — M129 Arthropathy, unspecified: Secondary | ICD-10-CM | POA: Insufficient documentation

## 2014-03-02 DIAGNOSIS — I10 Essential (primary) hypertension: Secondary | ICD-10-CM | POA: Insufficient documentation

## 2014-03-02 DIAGNOSIS — D126 Benign neoplasm of colon, unspecified: Secondary | ICD-10-CM | POA: Insufficient documentation

## 2014-03-02 DIAGNOSIS — K644 Residual hemorrhoidal skin tags: Secondary | ICD-10-CM | POA: Insufficient documentation

## 2014-03-02 DIAGNOSIS — Z79899 Other long term (current) drug therapy: Secondary | ICD-10-CM | POA: Insufficient documentation

## 2014-03-02 DIAGNOSIS — F17200 Nicotine dependence, unspecified, uncomplicated: Secondary | ICD-10-CM | POA: Insufficient documentation

## 2014-03-02 DIAGNOSIS — M545 Low back pain, unspecified: Secondary | ICD-10-CM | POA: Insufficient documentation

## 2014-03-02 HISTORY — PX: COLONOSCOPY: SHX5424

## 2014-03-02 HISTORY — DX: Dorsalgia, unspecified: M54.9

## 2014-03-02 HISTORY — DX: Other chronic pain: G89.29

## 2014-03-02 SURGERY — COLONOSCOPY
Anesthesia: Moderate Sedation

## 2014-03-02 MED ORDER — MEPERIDINE HCL 50 MG/ML IJ SOLN
INTRAMUSCULAR | Status: DC | PRN
  Administered 2014-03-02 (×2): 25 mg via INTRAVENOUS

## 2014-03-02 MED ORDER — MIDAZOLAM HCL 5 MG/ML IJ SOLN: 2.0000 mg | Freq: Once | INTRAMUSCULAR | Status: DC

## 2014-03-02 MED ORDER — MIDAZOLAM HCL 2 MG/2ML IJ SOLN
INTRAMUSCULAR | Status: AC
  Filled 2014-03-02: qty 2

## 2014-03-02 MED ORDER — MIDAZOLAM HCL 5 MG/5ML IJ SOLN
INTRAMUSCULAR | Status: DC | PRN
  Administered 2014-03-02: 3 mg via INTRAVENOUS
  Administered 2014-03-02 (×2): 2 mg via INTRAVENOUS
  Administered 2014-03-02: 1 mg via INTRAVENOUS
  Administered 2014-03-02: 2 mg via INTRAVENOUS

## 2014-03-02 MED ORDER — MEPERIDINE HCL 50 MG/ML IJ SOLN
INTRAMUSCULAR | Status: AC
  Filled 2014-03-02: qty 1

## 2014-03-02 MED ORDER — MIDAZOLAM HCL 5 MG/5ML IJ SOLN
INTRAMUSCULAR | Status: AC
  Administered 2014-03-02: 2 mg
  Filled 2014-03-02: qty 10

## 2014-03-02 MED ORDER — STERILE WATER FOR IRRIGATION IR SOLN
Status: DC | PRN
  Administered 2014-03-02: 09:00:00

## 2014-03-02 MED ORDER — SODIUM CHLORIDE 0.9 % IV SOLN
INTRAVENOUS | Status: DC
  Administered 2014-03-02: 09:00:00 via INTRAVENOUS

## 2014-03-02 NOTE — H&P (Signed)
Alicia Fritz is an 48 y.o. female.   Chief Complaint: Patient is here for colonoscopy. HPI: Patient is 48 year old Caucasian female who is here for a screening colonoscopy. Patient's last exam was in March 22 and was normal. Family history significant for colon carcinoma in her father who was 80 at the time of diagnosis and died at 36 of MI while waiting for liver transplant for alcoholic cirrhosis. Patient's mother was diagnosed with colon carcinoma and 56 and died last year of liver metastases and 79. Denies abdominal pain change in bowel habits or rectal bleeding.  Past Medical History  Diagnosis Date  . Allergy   . Night sweats   . Bronchitis   . Hypertension   . Pneumonia   . GERD (gastroesophageal reflux disease)   . Arthritis   . Stress   . Chronic back pain     Past Surgical History  Procedure Laterality Date  . Appendectomy    . Abdominal hysterectomy    . Left foot surgery  1993    Family History  Problem Relation Age of Onset  . Cancer Mother   . Diabetes Mother   . Hypertension Mother   . Colon cancer Mother   . Cancer Father   . Diabetes Father   . Colon cancer Father   . Diabetes Sister   . Hyperlipidemia Sister    Social History:  reports that she has been smoking Cigarettes.  She has a 3.5 pack-year smoking history. She does not have any smokeless tobacco history on file. She reports that she does not drink alcohol or use illicit drugs.  Allergies:  Allergies  Allergen Reactions  . Penicillins   . Sulfa Drugs Cross Reactors     Medications Prior to Admission  Medication Sig Dispense Refill  . ALPRAZolam (XANAX) 0.5 MG tablet Take 0.5 mg by mouth 2 (two) times daily as needed for anxiety.       Marland Kitchen amphetamine-dextroamphetamine (ADDERALL) 30 MG tablet Take 30 mg by mouth daily.      . DULoxetine (CYMBALTA) 60 MG capsule Take 60 mg by mouth daily.        . hydrocodone-acetaminophen (LORCET-HD) 5-500 MG per capsule Take 1 capsule by mouth every 6  (six) hours as needed for pain.       . valsartan (DIOVAN) 80 MG tablet Take 80 mg by mouth daily.        . Vitamin D, Ergocalciferol, (DRISDOL) 50000 UNITS CAPS capsule Take 50,000 Units by mouth every 7 (seven) days.      Marland Kitchen ibuprofen (ADVIL,MOTRIN) 200 MG tablet Take 200 mg by mouth every 6 (six) hours as needed for moderate pain.      . peg 3350 powder (MOVIPREP) 100 G SOLR Take 1 kit (200 g total) by mouth once.  1 kit  0    No results found for this or any previous visit (from the past 48 hour(s)). No results found.  ROS  Blood pressure 123/76, pulse 81, temperature 97.7 F (36.5 C), temperature source Oral, resp. rate 18, height 5' 6.5" (1.689 m), weight 145 lb (65.772 kg), SpO2 99.00%. Physical Exam  Constitutional: She appears well-developed and well-nourished.  HENT:  Mouth/Throat: Oropharynx is clear and moist.  Eyes: Conjunctivae are normal. No scleral icterus.  Neck: No thyromegaly present.  Cardiovascular:  No murmur heard. Respiratory: Effort normal and breath sounds normal.  GI: Soft. She exhibits no distension and no mass. There is no tenderness.  Musculoskeletal: She exhibits no edema.  Lymphadenopathy:    She has no cervical adenopathy.  Neurological: She is alert.  Skin: Skin is warm and dry.     Assessment/Plan Hider screening colonoscopy.  REHMAN,NAJEEB U 03/02/2014, 9:18 AM

## 2014-03-02 NOTE — Op Note (Signed)
COLONOSCOPY PROCEDURE REPORT  PATIENT:  Alicia Fritz  MR#:  [id number] Birthdate:  12-21-1965, 48 y.o., female Endoscopist:  Dr. Rogene Houston, MD Referred By:  Dr. Virgel Gess, DO  Procedure Date: 03/02/2014  Procedure:   Colonoscopy  Indications:  Patient is 76 usual Caucasian female who is undergoing screening colonoscopy. Patient's last exam was normal in March 2010. History significant for colon carcinoma in father with surgery at 72 and died of unrelated causes at age 102. Mother was diagnosed with colon carcinoma at age 59 and died of liver metastases at 77.  Informed Consent:  The procedure and risks were reviewed with the patient and informed consent was obtained.  Medications:  Demerol 50 mg IV Versed 10 mg IV  Description of procedure:  After a digital rectal exam was performed, that colonoscope was advanced from the anus through the rectum and colon to the area of the cecum, ileocecal valve and appendiceal orifice. The cecum was deeply intubated. These structures were well-seen and photographed for the record. From the level of the cecum and ileocecal valve, the scope was slowly and cautiously withdrawn. The mucosal surfaces were carefully surveyed utilizing scope tip to flexion to facilitate fold flattening as needed. The scope was pulled down into the rectum where a thorough exam including retroflexion was performed.  Findings:   Prep excellent. Small polyp ablated via cold biopsy from the splenic flexure. Normal rectal mucosa. Small hemorrhoids below the dentate line.   Therapeutic/Diagnostic Maneuvers Performed:  See above  Complications:  None  Cecal Withdrawal Time:  11 minutes  Impression:  Examination performed to cecum. Small polyp ablated via cold biopsy from splenic flexure. Small external hemorrhoids.  Recommendations:  Standard instructions given. I will contact patient with biopsy results and further recommendations.  Jigar Zielke U   03/02/2014 9:57 AM  CC: Dr. Melina Copa, Caren Griffins, DO & Dr. No ref. provider found

## 2014-03-02 NOTE — Discharge Instructions (Signed)
Resume usual medications and diet. No driving for 24 hours. Physician will call with biopsy results.

## 2014-03-04 ENCOUNTER — Encounter (HOSPITAL_COMMUNITY): Payer: Self-pay | Admitting: Internal Medicine

## 2014-03-10 ENCOUNTER — Encounter (INDEPENDENT_AMBULATORY_CARE_PROVIDER_SITE_OTHER): Payer: Self-pay | Admitting: *Deleted

## 2014-09-06 ENCOUNTER — Other Ambulatory Visit: Payer: 59 | Admitting: Obstetrics & Gynecology

## 2014-09-12 ENCOUNTER — Other Ambulatory Visit: Payer: 59 | Admitting: Obstetrics & Gynecology

## 2014-09-19 ENCOUNTER — Other Ambulatory Visit (HOSPITAL_COMMUNITY): Payer: Self-pay | Admitting: *Deleted

## 2014-09-19 ENCOUNTER — Encounter: Payer: Self-pay | Admitting: Women's Health

## 2014-09-19 ENCOUNTER — Ambulatory Visit (INDEPENDENT_AMBULATORY_CARE_PROVIDER_SITE_OTHER): Payer: 59 | Admitting: Women's Health

## 2014-09-19 VITALS — BP 108/72 | Ht 66.0 in | Wt 151.0 lb

## 2014-09-19 DIAGNOSIS — F17200 Nicotine dependence, unspecified, uncomplicated: Secondary | ICD-10-CM

## 2014-09-19 DIAGNOSIS — F32A Depression, unspecified: Secondary | ICD-10-CM

## 2014-09-19 DIAGNOSIS — Z1231 Encounter for screening mammogram for malignant neoplasm of breast: Secondary | ICD-10-CM

## 2014-09-19 DIAGNOSIS — Z01419 Encounter for gynecological examination (general) (routine) without abnormal findings: Secondary | ICD-10-CM

## 2014-09-19 DIAGNOSIS — Z90710 Acquired absence of both cervix and uterus: Secondary | ICD-10-CM

## 2014-09-19 DIAGNOSIS — F329 Major depressive disorder, single episode, unspecified: Secondary | ICD-10-CM

## 2014-09-19 NOTE — Progress Notes (Signed)
Patient ID: Alicia Fritz, female   DOB: [birth year], 48 y.o.   MRN: [id number] Subjective:   Alicia Fritz is a 48 y.o. Caucasian G65P1001 female here for a routine well-woman exam.  No LMP recorded. Patient has had a hysterectomy.    Current complaints: depression, managed by dr. Melina Copa pcp. No SI/HI/II. Concerned b/c her insurance runs out 12/31- plans to go to [place] to check on mcaid.  PCP: dr. Melina Copa       Does not desire labs, pcp does yearly  Social History: Sexual: heterosexual Marital Status: divorced Living situation: alone with son Occupation: self-employed hairdresser Tobacco/alcohol: smokes 1ppd interested in quitting, etoh: occ Illicit drugs: no history of illicit drug use  The following portions of the patient's history were reviewed and updated as appropriate: allergies, current medications, past family history, past medical history, past social history, past surgical history and problem list.  Past Medical History Past Medical History  Diagnosis Date  . Allergy   . Night sweats   . Bronchitis   . Hypertension   . Pneumonia   . GERD (gastroesophageal reflux disease)   . Arthritis   . Stress   . Chronic back pain     Past Surgical History Past Surgical History  Procedure Laterality Date  . Appendectomy    . Abdominal hysterectomy    . Left foot surgery  1993  . Colonoscopy N/A 03/02/2014    Procedure: COLONOSCOPY;  Surgeon: Rogene Houston, MD;  Location: [facility];  Service: Endoscopy;  Laterality: N/A;  930    Gynecologic History No obstetric history on file.  No LMP recorded. Patient has had a hysterectomy. Contraception: status post hysterectomy Last Pap: 2009. Results were: normal Last mammogram: 2009. Results were: normal Last TCS: 03/02/14 had benign polyps- recommended another TCS in 5 yrs  Obstetric History OB History  No data available    Current Medications Current Outpatient Prescriptions on File Prior to Visit  Medication  Sig Dispense Refill  . ALPRAZolam (XANAX) 0.5 MG tablet Take 0.5 mg by mouth daily.     Marland Kitchen amphetamine-dextroamphetamine (ADDERALL) 30 MG tablet Take 30 mg by mouth daily.    . DULoxetine (CYMBALTA) 60 MG capsule Take 60 mg by mouth daily.       No current facility-administered medications on file prior to visit.    Review of Systems Patient denies any headaches, blurred vision, shortness of breath, chest pain, abdominal pain, problems with bowel movements, urination, or intercourse.  Objective:  BP 108/72 mmHg  Ht 5\' 6"  (1.676 m)  Wt 151 lb (68.493 kg)  BMI 24.38 kg/m2 Physical Exam  General:  Well developed, well nourished, no acute distress. She is alert and oriented x3. Skin:  Warm and dry Neck:  Midline trachea, no thyromegaly or nodules Cardiovascular: Regular rate and rhythm, no murmur heard Lungs:  Effort normal, all lung fields clear to auscultation bilaterally Breasts:  No dominant palpable mass, retraction, or nipple discharge Abdomen:  Soft, non tender, no hepatosplenomegaly or masses Pelvic:  External genitalia is normal in appearance.  The vagina is normal in appearance. The cervix is bulbous, no CMT.  Thin prep pap is not done, s/p partial hysterectomy for fibroids/menorrhagia.   Uterus is surgically absent.  No adnexal masses or tenderness noted. Rectal: Good sphincter tone, no polyps, or hemorrhoids felt.   Extremities:  No swelling or varicosities noted Psych:  She has a normal mood and affect  Assessment:   Healthy well-woman exam Depression Smoker  Plan:   F/U 49yr for physical, or sooner if needed Mammogram already scheduled for 12/30 @ 2:15pm at [facility]  Colonoscopy 2020 as recommended by GI or sooner if problems F/U w/ pcp as needed for depression Smokes 1/day, advised cessation, discussed risks to fetus while pregnant, to infant pp, and to herself. Offered [facility], accepted, referral sent.      Tawnya Crook CNM, [facility] 09/19/2014 11:50  AM

## 2014-09-21 ENCOUNTER — Ambulatory Visit (HOSPITAL_COMMUNITY)
Admission: RE | Admit: 2014-09-21 | Discharge: 2014-09-21 | Disposition: A | Discharge status: Home / Self Care | Payer: 59 | Source: Ambulatory Visit | Attending: *Deleted | Admitting: *Deleted

## 2014-09-21 DIAGNOSIS — Z1231 Encounter for screening mammogram for malignant neoplasm of breast: Secondary | ICD-10-CM | POA: Insufficient documentation

## 2019-02-18 ENCOUNTER — Encounter (INDEPENDENT_AMBULATORY_CARE_PROVIDER_SITE_OTHER): Payer: Self-pay | Admitting: *Deleted

## 2021-01-09 ENCOUNTER — Encounter (INDEPENDENT_AMBULATORY_CARE_PROVIDER_SITE_OTHER): Payer: Self-pay | Admitting: *Deleted

## 2021-10-01 DIAGNOSIS — R69 Illness, unspecified: Secondary | ICD-10-CM | POA: Diagnosis not present

## 2021-10-01 DIAGNOSIS — E785 Hyperlipidemia, unspecified: Secondary | ICD-10-CM | POA: Diagnosis not present

## 2021-10-01 DIAGNOSIS — I10 Essential (primary) hypertension: Secondary | ICD-10-CM | POA: Diagnosis not present

## 2021-11-07 DIAGNOSIS — J069 Acute upper respiratory infection, unspecified: Secondary | ICD-10-CM | POA: Diagnosis not present

## 2021-12-31 DIAGNOSIS — E785 Hyperlipidemia, unspecified: Secondary | ICD-10-CM | POA: Diagnosis not present

## 2021-12-31 DIAGNOSIS — I10 Essential (primary) hypertension: Secondary | ICD-10-CM | POA: Diagnosis not present

## 2021-12-31 DIAGNOSIS — R69 Illness, unspecified: Secondary | ICD-10-CM | POA: Diagnosis not present

## 2022-04-01 ENCOUNTER — Encounter (INDEPENDENT_AMBULATORY_CARE_PROVIDER_SITE_OTHER): Payer: Self-pay | Admitting: *Deleted

## 2022-05-14 ENCOUNTER — Ambulatory Visit
Admission: EM | Admit: 2022-05-14 | Discharge: 2022-05-14 | Disposition: A | Discharge status: Home / Self Care | Payer: 59 | Attending: Family Medicine | Admitting: Family Medicine

## 2022-05-14 ENCOUNTER — Encounter: Payer: Self-pay | Admitting: Emergency Medicine

## 2022-05-14 ENCOUNTER — Other Ambulatory Visit: Payer: Self-pay

## 2022-05-14 DIAGNOSIS — R030 Elevated blood-pressure reading, without diagnosis of hypertension: Secondary | ICD-10-CM

## 2022-05-14 DIAGNOSIS — G44209 Tension-type headache, unspecified, not intractable: Secondary | ICD-10-CM | POA: Diagnosis not present

## 2022-05-14 DIAGNOSIS — M792 Neuralgia and neuritis, unspecified: Secondary | ICD-10-CM

## 2022-05-14 MED ORDER — NAPROXEN 500 MG PO TABS: 500.0000 mg | ORAL_TABLET | Freq: Two times a day (BID) | ORAL | 0 refills | Status: DC | PRN

## 2022-05-14 MED ORDER — CYCLOBENZAPRINE HCL 10 MG PO TABS: 10.0000 mg | ORAL_TABLET | Freq: Three times a day (TID) | ORAL | 0 refills | Status: DC | PRN

## 2022-05-14 NOTE — ED Provider Notes (Signed)
[facility]    CSN: [id number] Arrival date & time: 05/14/22  1844      History   Chief Complaint Chief Complaint  Patient presents with   Headache    HPI Alicia Fritz is a 56 y.o. female.   Presenting today concerned about several symptoms that Alicia Fritz has been having recently as well as elevated blood pressure readings.  Alicia Fritz states Alicia Fritz checked her blood pressure at [facility] and then again with a wrist cuff that one of her clients had with her.  Her pressures when checked recently have been running around 160-170/100-110 range per patient.  Alicia Fritz states Alicia Fritz takes losartan currently which keeps her blood pressures in the 130s over 80s range typically.  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  Alicia Fritz feels the ache that Alicia Fritz has is tension related as it starts at the neck and goes all the way up to the top of her scalp and her neck has been sore.  Alicia Fritz is also having some mild tingling in her left fingertips and soreness and heaviness extending down the left arm recently.  Denies chest pain, shortness of breath, palpitations, visual changes, significant dizziness.  So far not trying any medications over-the-counter for symptoms.    Past Medical History:  Diagnosis Date   Allergy    Arthritis    Bronchitis    Chronic back pain    GERD (gastroesophageal reflux disease)    Hypertension    Night sweats    Pneumonia    Stress     Patient Active Problem List   Diagnosis Date Noted   Smoker 09/19/2014   Depression 09/19/2014   H/O: hysterectomy 09/19/2014    Past Surgical History:  Procedure Laterality Date   ABDOMINAL HYSTERECTOMY     APPENDECTOMY     COLONOSCOPY N/A 03/02/2014   Procedure: COLONOSCOPY;  Surgeon: Rogene Houston, MD;  Location: [facility];  Service: Endoscopy;  Laterality: N/A;  930   Left  foot surgery  1993    OB History   No obstetric history on file.      Home Medications    Prior to Admission medications   Medication Sig Start Date End Date Taking? Authorizing Provider  cyclobenzaprine (FLEXERIL) 10 MG tablet Take 1 tablet (10 mg total) by mouth 3 (three) times daily as needed for muscle spasms. Do not drink alcohol or drive while taking this medication.  May cause drowsiness. 05/14/22  Yes Volney American, PA-C  naproxen (NAPROSYN) 500 MG tablet Take 1 tablet (500 mg total) by mouth 2 (two) times daily as needed. 05/14/22  Yes Volney American, PA-C  ALPRAZolam Duanne Moron) 0.5 MG tablet Take 0.5 mg by mouth daily.     [provider]  amphetamine-dextroamphetamine (ADDERALL) 30 MG tablet Take 30 mg by mouth daily.    [provider]  cholecalciferol (VITAMIN D) 1000 UNITS tablet Take 5,000 Units by mouth daily.    [provider]  DULoxetine (CYMBALTA) 60 MG capsule Take 60 mg by mouth daily.      [provider]  HYDROcodone-acetaminophen (NORCO/VICODIN) 5-325 MG per tablet Take 1 tablet by mouth daily as needed for moderate pain.    [provider]  valsartan (DIOVAN) 160 MG tablet Take 160 mg by mouth daily.    [provider]  Family History Family History  Problem Relation Age of Onset   Cancer Mother        liver,lung   Diabetes Mother    Hypertension Mother    Colon cancer Mother    Diabetes Father    Colon cancer Father    Cirrhosis Father    Diabetes Sister    Hyperlipidemia Sister    Hypertension Maternal Grandmother    Mental illness Maternal Grandmother    Dementia Maternal Grandmother    Kidney disease Maternal Grandmother    Diabetes Maternal Grandfather    Mental illness Maternal Grandfather    Heart attack Paternal Grandmother    Hypertension Paternal Grandmother    Diabetes Paternal Grandmother    Cirrhosis Paternal Grandfather    Depression Sister    Hyperlipidemia  Sister    Diabetes Sister     Social History Social History   Tobacco Use   Smoking status: Every Day    Packs/day: 0.50    Years: 7.00    Total pack years: 3.50    Types: Cigarettes   Smokeless tobacco: Never  Substance Use Topics   Alcohol use: Yes    Comment: glass of wine once a month   Drug use: No     Allergies   Penicillins and Sulfa drugs cross reactors   Review of Systems Review of Systems Per HPI  Physical Exam Triage Vital Signs ED Triage Vitals  Enc Vitals Group     BP 05/14/22 1922 (!) 145/80     Pulse Rate 05/14/22 1922 83     Resp 05/14/22 1922 16     Temp 05/14/22 1922 98.2 F (36.8 C)     Temp Source 05/14/22 1922 Oral     SpO2 05/14/22 1922 95 %     Weight --      Height --      Head Circumference --      Peak Flow --      Pain Score 05/14/22 1931 3     Pain Loc --      Pain Edu? --      Excl. in [place]? --    No data found.  Updated Vital Signs BP (!) 145/80 (BP Location: Right Arm)   Pulse 83   Temp 98.2 F (36.8 C) (Oral)   Resp 16   SpO2 95%   Visual Acuity Right Eye Distance:   Left Eye Distance:   Bilateral Distance:    Right Eye Near:   Left Eye Near:    Bilateral Near:     Physical Exam Vitals and nursing note reviewed.  Constitutional:      Appearance: Normal appearance. Alicia Fritz is not ill-appearing.  HENT:     Head: Atraumatic.     Mouth/Throat:     Mouth: Mucous membranes are moist.  Eyes:     Extraocular Movements: Extraocular movements intact.     Conjunctiva/sclera: Conjunctivae normal.  Cardiovascular:     Rate and Rhythm: Normal rate and regular rhythm.     Heart sounds: Normal heart sounds.  Pulmonary:     Effort: Pulmonary effort is normal.     Breath sounds: Normal breath sounds. No wheezing or rales.  Musculoskeletal:        General: Tenderness present. No swelling. Normal range of motion.     Cervical back: Normal range of motion and neck supple.     Comments: Left deltoid tenderness to palpation.   Grip strength full and equal bilateral hands.  Range of  motion full and intact diffusely  Skin:    General: Skin is warm and dry.  Neurological:     General: No focal deficit present.     Mental Status: Alicia Fritz is alert and oriented to person, place, and time.     Cranial Nerves: No cranial nerve deficit.     Motor: No weakness.     Gait: Gait normal.     Comments: All 4 extremities neurovascularly intact  Psychiatric:        Mood and Affect: Mood normal.        Thought Content: Thought content normal.        Judgment: Judgment normal.      UC Treatments / Results  Labs (all labs ordered are listed, but only abnormal results are displayed) Labs Reviewed - No data to display  EKG   Radiology No results found.  Procedures Procedures (including critical care time)  Medications Ordered in UC Medications - No data to display  Initial Impression / Assessment and Plan / UC Course  I have reviewed the triage vital signs and the nursing notes.  Pertinent labs & imaging results that were available during my care of the patient were reviewed by me and considered in my medical decision making (see chart for details).     Suspect her elevated blood pressure readings may be due to significant stress in her life currently, pain from suspected muscular tension from physical labor out in the sun.  Her EKG today is very reassuring, showing normal sinus rhythm at 81 bpm with no acute ST or T wave changes.  There are no old EKGs to compare to today.  Her vital signs are overall reassuring with minimal elevation of blood pressure at 145/80 today.  Otherwise vital signs within normal limits.  We will treat with Flexeril, naproxen and discussed stretches, heat, massage, rest.  Work note given.  Return for any worsening symptoms at any time.  Follow-up with primary care provider to recheck blood pressures in the next week.  Alicia Fritz declines adjustment of her losartan dose at this time.  Final Clinical  Impressions(s) / UC Diagnoses   Final diagnoses:  Elevated blood pressure reading  Tension headache  Radicular pain in left arm     Discharge Instructions      Continue to monitor your blood pressure readings at home.  Make sure to drink lots of fluids, get lots of rest, keep your stress levels under control as much as possible.  Your work-up today was reassuring but if your symptoms worsen at any point please go to the emergency department for further evaluation.  I have sent over a muscle relaxer and anti-inflammatory pain medication as I suspect your headache is related to muscle tension in your neck, similar to your left arm pain and numbness which I believe to be related to some muscular tension.  Follow-up with your care provider as soon as possible    ED Prescriptions     Medication Sig Dispense Auth. Provider   cyclobenzaprine (FLEXERIL) 10 MG tablet Take 1 tablet (10 mg total) by mouth 3 (three) times daily as needed for muscle spasms. Do not drink alcohol or drive while taking this medication.  May cause drowsiness. 15 tablet Volney American, [place]   naproxen (NAPROSYN) 500 MG tablet Take 1 tablet (500 mg total) by mouth 2 (two) times daily as needed. 30 tablet Volney American, [place]      PDMP not reviewed this encounter.   Orene Desanctis,  Lilia Argue, PA-C 05/14/22 2004

## 2022-05-14 NOTE — ED Triage Notes (Addendum)
Pt reports intermittent headache, dizziness, nausea, left shoulder pain/heaviness, general malaise. Most recent episode started at noon today. Pt alert, speech clear,steady gait. Airway patent. Denies any visual changes. Shortness of breath on Sunday denies at present.   PA aware and verbal order for EKG.

## 2022-05-14 NOTE — Discharge Instructions (Signed)
Continue to monitor your blood pressure readings at home.  Make sure to drink lots of fluids, get lots of rest, keep your stress levels under control as much as possible.  Your work-up today was reassuring but if your symptoms worsen at any point please go to the emergency department for further evaluation.  I have sent over a muscle relaxer and anti-inflammatory pain medication as I suspect your headache is related to muscle tension in your neck, similar to your left arm pain and numbness which I believe to be related to some muscular tension.  Follow-up with your care provider as soon as possible

## 2022-05-16 ENCOUNTER — Encounter (INDEPENDENT_AMBULATORY_CARE_PROVIDER_SITE_OTHER): Payer: Self-pay

## 2022-05-16 ENCOUNTER — Telehealth (INDEPENDENT_AMBULATORY_CARE_PROVIDER_SITE_OTHER): Payer: Self-pay

## 2022-05-16 ENCOUNTER — Encounter (INDEPENDENT_AMBULATORY_CARE_PROVIDER_SITE_OTHER): Payer: Self-pay | Admitting: Gastroenterology

## 2022-05-16 ENCOUNTER — Other Ambulatory Visit (INDEPENDENT_AMBULATORY_CARE_PROVIDER_SITE_OTHER): Payer: Self-pay

## 2022-05-16 ENCOUNTER — Ambulatory Visit (INDEPENDENT_AMBULATORY_CARE_PROVIDER_SITE_OTHER): Payer: Self-pay | Admitting: Gastroenterology

## 2022-05-16 VITALS — BP 119/78 | HR 96 | Temp 98.4°F | Ht 66.0 in | Wt 140.9 lb

## 2022-05-16 DIAGNOSIS — K5900 Constipation, unspecified: Secondary | ICD-10-CM

## 2022-05-16 DIAGNOSIS — Z800 Family history of malignant neoplasm of digestive organs: Secondary | ICD-10-CM

## 2022-05-16 DIAGNOSIS — K625 Hemorrhage of anus and rectum: Secondary | ICD-10-CM

## 2022-05-16 MED ORDER — PEG 3350-KCL-NA BICARB-NACL 420 G PO SOLR: 4000.0000 mL | ORAL | 0 refills | Status: DC

## 2022-05-16 NOTE — Patient Instructions (Signed)
It was nice to meet you!  We will get you scheduled for colonoscopy for further evaluation of rectal bleeding and due to your family history Please try to increase water intake to about 64 oz per day, you can try adding in a fiber supplement such as benefiber 1T 2-3x/day with meals to help with constipation  Follow up will be determined after colonoscopy

## 2022-05-16 NOTE — Telephone Encounter (Signed)
Alicia Fritz [name], CMA  ?

## 2022-05-16 NOTE — Telephone Encounter (Signed)
Alicia Fritz Alicia Fritz, CMA  ?

## 2022-05-16 NOTE — Progress Notes (Signed)
Referring Provider: Adaline Sill, NP Primary Care Physician:  Adaline Sill, NP Primary GI Physician: new  Chief Complaint  Patient presents with   Rectal Bleeding    New patient. Referred for rectal bleeding. Reports bleeding off and on for a couple of years. More often recently. Would like to schedule colonoscopy.    HPI:   Alicia Fritz is a 56 y.o. female with past medical history of arthritis, back pain, GERD, HTN.   Patient presenting today for rectal bleeding.  Patient reports rectal bleeding for the past couple of years, though seems to be occurring more often than previously. May happen a few times per month. Has some constipation, though having a BM once per day, sometimes has to strain, depends on what she eats. She does not take any medication for constipation. Sometimes will drink some juice if she feels that stools are too hard, which helps. Denies abdominal pain, no weight loss or changes in appetite. Denies melena, vomiting. Has some nausea recently but is having some issues with her sinuses. Denies any rectal burning, itching or discomfort.   NSAID use:on occasion  Social hx: occasional wine, smokes 1/2 PPD  Fam hx: father had CRC at 77, mother had colon cancer at age 33  Last Colonoscopy:2015 Examination performed to cecum. Small polyp ablated via cold biopsy from splenic flexure. Polyps benign  Small external hemorrhoids. Last Endoscopy:never  Recommendations:  Overdue for colonoscopy in 2020  Past Medical History:  Diagnosis Date   Allergy    Arthritis    Bronchitis    Chronic back pain    GERD (gastroesophageal reflux disease)    Hypertension    Night sweats    Pneumonia    Stress     Past Surgical History:  Procedure Laterality Date   ABDOMINAL HYSTERECTOMY     APPENDECTOMY     COLONOSCOPY N/A 03/02/2014   Procedure: COLONOSCOPY;  Surgeon: Rogene Houston, MD;  Location: [facility];  Service: Endoscopy;  Laterality: N/A;  930    Left foot surgery  1993    Current Outpatient Medications  Medication Sig Dispense Refill   ALPRAZolam (XANAX) 0.5 MG tablet Take 0.5 mg by mouth daily.     amphetamine-dextroamphetamine (ADDERALL) 30 MG tablet Take 30 mg by mouth daily.     B Complex Vitamins (B COMPLEX PO) Take by mouth. One daily     cholecalciferol (VITAMIN D) 1000 units tablet Take 1,000 Units by mouth daily.     COLLAGEN-VITAMIN C PO Take by mouth. One daily     DULoxetine (CYMBALTA) 60 MG capsule Take 60 mg by mouth daily.       Flaxseed, Linseed, (FLAX SEEDS PO) Take by mouth. One daily     loratadine (CLARITIN) 10 MG tablet Take 10 mg by mouth daily.     losartan (COZAAR) 100 MG tablet Take 100 mg by mouth daily.     OVER THE COUNTER MEDICATION OTC med - estrogen med start with A. Takes for hot flashes.     cyclobenzaprine (FLEXERIL) 10 MG tablet Take 1 tablet (10 mg total) by mouth 3 (three) times daily as needed for muscle spasms. Do not drink alcohol or drive while taking this medication.  May cause drowsiness. (Patient not taking: Reported on 05/16/2022) 15 tablet 0   HYDROcodone-acetaminophen (NORCO/VICODIN) 5-325 MG per tablet Take 1 tablet by mouth daily as needed for moderate pain. (Patient not taking: Reported on 05/16/2022)     naproxen (NAPROSYN) 500  MG tablet Take 1 tablet (500 mg total) by mouth 2 (two) times daily as needed. (Patient not taking: Reported on 05/16/2022) 30 tablet 0   No current facility-administered medications for this visit.    Allergies as of 05/16/2022 - Review Complete 05/16/2022  Allergen Reaction Noted   Penicillins  02/28/2011   Sulfa drugs cross reactors  02/28/2011    Family History  Problem Relation Age of Onset   Cancer Mother        liver,lung   Diabetes Mother    Hypertension Mother    Colon cancer Mother    Diabetes Father    Colon cancer Father    Cirrhosis Father    Diabetes Sister    Hyperlipidemia Sister    Hypertension Maternal Grandmother    Mental  illness Maternal Grandmother    Dementia Maternal Grandmother    Kidney disease Maternal Grandmother    Diabetes Maternal Grandfather    Mental illness Maternal Grandfather    Heart attack Paternal Grandmother    Hypertension Paternal Grandmother    Diabetes Paternal Grandmother    Cirrhosis Paternal Grandfather    Depression Sister    Hyperlipidemia Sister    Diabetes Sister     Social History   Socioeconomic History   Marital status: Legally Separated    Spouse name: Not on file   Number of children: Not on file   Years of education: Not on file   Highest education level: Not on file  Occupational History   Not on file  Tobacco Use   Smoking status: Every Day    Packs/day: 0.50    Years: 7.00    Total pack years: 3.50    Types: Cigarettes   Smokeless tobacco: Never  Substance and Sexual Activity   Alcohol use: Yes    Comment: glass of wine once a month   Drug use: No   Sexual activity: Yes    Birth control/protection: Surgical    Comment: hyst  Other Topics Concern   Not on file  Social History Narrative   Not on file   Social Determinants of Health   Financial Resource Strain: Not on file  Food Insecurity: Not on file  Transportation Needs: Not on file  Physical Activity: Not on file  Stress: Not on file  Social Connections: Not on file   Review of systems General: negative for malaise, night sweats, fever, chills, weight loss Neck: Negative for lumps, goiter, pain and significant neck swelling Resp: Negative for cough, wheezing, dyspnea at rest CV: Negative for chest pain, leg swelling, palpitations, orthopnea GI: denies melena, nausea, vomiting, diarrhea, dysphagia, odyonophagia, early satiety or unintentional weight loss. +hematochezia +constipation, MSK: Negative for joint pain or swelling, back pain, and muscle pain. Derm: Negative for itching or rash Psych: Denies depression, anxiety, memory loss, confusion. No homicidal or suicidal ideation.   Heme: Negative for prolonged bleeding, bruising easily, and swollen nodes. Endocrine: Negative for cold or heat intolerance, polyuria, polydipsia and goiter. Neuro: negative for tremor, gait imbalance, syncope and seizures. The remainder of the review of systems is noncontributory.  Physical Exam: BP 119/78 (BP Location: Left Arm, Patient Position: Sitting, Cuff Size: Normal)   Pulse 96   Temp 98.4 F (36.9 C) (Oral)   Ht '5\' 6"'$  (1.676 m)   Wt 140 lb 14.4 oz (63.9 kg)   BMI 22.74 kg/m  General:   Alert and oriented. No distress noted. Pleasant and cooperative.  Head:  Normocephalic and atraumatic. Eyes:  Conjuctiva clear  without scleral icterus. Mouth:  Oral mucosa pink and moist. Good dentition. No lesions. Heart: Normal rate and rhythm, s1 and s2 heart sounds present.  Lungs: Clear lung sounds in all lobes. Respirations equal and unlabored. Abdomen:  +BS, soft, non-tender and non-distended. No rebound or guarding. No HSM or masses noted. Derm: No palmar erythema or jaundice Msk:  Symmetrical without gross deformities. Normal posture. Extremities:  Without edema. Neurologic:  Alert and  oriented x4 Psych:  Alert and cooperative. Normal mood and affect.  Invalid input(s): "6 MONTHS"   ASSESSMENT: SHENIA ALAN is a 56 y.o. female presenting today as a new patient for rectal bleeding  Rectal bleeding has been ongoing for the past few years though more frequent recently, occurring maybe a few times per month. Last TCS was in 2015 with benign polyps and hemorrhoids. She has significant fam hx of CRC in both her mom and dad. She denies weight loss, changes in appetite or bowel habits. Has some constipation but usually has a Bm everyday and drinks juice which helps. Recommend proceeding with colonoscopy given her family history and rectal bleeding, suspect bleeding is secondary to hemorrhoids in setting of constipation, however, cannot rule out other causes such as malignancy.  Indications, risks and benefits of procedure discussed in detail with patient. Patient verbalized understanding and is in agreement to proceed with Colonoscopy at this time.    PLAN:  Colonoscopy-ASA III  2. Increase water intake  3. Start benefiber 1T 2-3x/day with meals   All questions were answered, patient verbalized understanding and is in agreement with plan as outlined above.    Follow Up: TBD after colonoscopy   Sheralyn Pinegar L. Alver Sorrow, MSN, APRN, AGNP-C Adult-Gerontology Nurse Practitioner [facility]

## 2022-05-17 ENCOUNTER — Encounter (INDEPENDENT_AMBULATORY_CARE_PROVIDER_SITE_OTHER): Payer: Self-pay

## 2022-06-26 ENCOUNTER — Encounter (HOSPITAL_COMMUNITY)
Admission: RE | Admit: 2022-06-26 | Discharge: 2022-06-26 | Disposition: A | Discharge status: Home / Self Care | Payer: 59 | Source: Ambulatory Visit | Attending: Gastroenterology | Admitting: Gastroenterology

## 2022-06-26 ENCOUNTER — Encounter (HOSPITAL_COMMUNITY): Payer: Self-pay

## 2022-06-26 HISTORY — DX: Anxiety disorder, unspecified: F41.9

## 2022-06-26 HISTORY — DX: Attention-deficit hyperactivity disorder, unspecified type: F90.9

## 2022-07-01 ENCOUNTER — Other Ambulatory Visit (INDEPENDENT_AMBULATORY_CARE_PROVIDER_SITE_OTHER): Payer: Self-pay | Admitting: *Deleted

## 2022-07-01 MED ORDER — PEG 3350-KCL-NA BICARB-NACL 420 G PO SOLR: 4000.0000 mL | ORAL | 0 refills | Status: DC

## 2022-07-02 ENCOUNTER — Encounter (HOSPITAL_COMMUNITY)
Admission: RE | Disposition: A | Discharge status: Home / Self Care | Payer: Self-pay | Source: Home / Self Care | Attending: Gastroenterology

## 2022-07-02 ENCOUNTER — Ambulatory Visit (HOSPITAL_COMMUNITY)
Admission: RE | Admit: 2022-07-02 | Discharge: 2022-07-02 | Disposition: A | Discharge status: Home / Self Care | Payer: 59 | Attending: Gastroenterology | Admitting: Gastroenterology

## 2022-07-02 ENCOUNTER — Encounter (HOSPITAL_COMMUNITY): Payer: Self-pay | Admitting: Gastroenterology

## 2022-07-02 ENCOUNTER — Ambulatory Visit (HOSPITAL_BASED_OUTPATIENT_CLINIC_OR_DEPARTMENT_OTHER): Payer: 59 | Admitting: Anesthesiology

## 2022-07-02 ENCOUNTER — Other Ambulatory Visit: Payer: Self-pay

## 2022-07-02 ENCOUNTER — Ambulatory Visit (HOSPITAL_COMMUNITY): Payer: 59 | Admitting: Anesthesiology

## 2022-07-02 DIAGNOSIS — G8929 Other chronic pain: Secondary | ICD-10-CM | POA: Diagnosis not present

## 2022-07-02 DIAGNOSIS — F17210 Nicotine dependence, cigarettes, uncomplicated: Secondary | ICD-10-CM | POA: Diagnosis not present

## 2022-07-02 DIAGNOSIS — I10 Essential (primary) hypertension: Secondary | ICD-10-CM | POA: Diagnosis not present

## 2022-07-02 DIAGNOSIS — K5521 Angiodysplasia of colon with hemorrhage: Secondary | ICD-10-CM | POA: Diagnosis not present

## 2022-07-02 DIAGNOSIS — Z800 Family history of malignant neoplasm of digestive organs: Secondary | ICD-10-CM | POA: Diagnosis not present

## 2022-07-02 DIAGNOSIS — K219 Gastro-esophageal reflux disease without esophagitis: Secondary | ICD-10-CM | POA: Insufficient documentation

## 2022-07-02 DIAGNOSIS — F909 Attention-deficit hyperactivity disorder, unspecified type: Secondary | ICD-10-CM | POA: Diagnosis not present

## 2022-07-02 DIAGNOSIS — M1990 Unspecified osteoarthritis, unspecified site: Secondary | ICD-10-CM | POA: Diagnosis not present

## 2022-07-02 DIAGNOSIS — F32A Depression, unspecified: Secondary | ICD-10-CM | POA: Insufficient documentation

## 2022-07-02 DIAGNOSIS — F419 Anxiety disorder, unspecified: Secondary | ICD-10-CM | POA: Diagnosis not present

## 2022-07-02 DIAGNOSIS — M549 Dorsalgia, unspecified: Secondary | ICD-10-CM | POA: Diagnosis not present

## 2022-07-02 DIAGNOSIS — K625 Hemorrhage of anus and rectum: Secondary | ICD-10-CM | POA: Insufficient documentation

## 2022-07-02 HISTORY — PX: HEMOSTASIS CLIP PLACEMENT: SHX6857

## 2022-07-02 HISTORY — PX: SUBMUCOSAL INJECTION: SHX5543

## 2022-07-02 HISTORY — PX: SUBMUCOSAL TATTOO INJECTION: SHX6856

## 2022-07-02 HISTORY — PX: HOT HEMOSTASIS: SHX5433

## 2022-07-02 HISTORY — PX: COLONOSCOPY WITH PROPOFOL: SHX5780

## 2022-07-02 LAB — HM COLONOSCOPY

## 2022-07-02 SURGERY — COLONOSCOPY WITH PROPOFOL
Anesthesia: General

## 2022-07-02 MED ORDER — PROPOFOL 500 MG/50ML IV EMUL
INTRAVENOUS | Status: AC
  Filled 2022-07-02: qty 150

## 2022-07-02 MED ORDER — LACTATED RINGERS IV SOLN: INTRAVENOUS | Status: DC

## 2022-07-02 MED ORDER — EPHEDRINE 5 MG/ML INJ
INTRAVENOUS | Status: AC
  Filled 2022-07-02: qty 5

## 2022-07-02 MED ORDER — SPOT INK MARKER SYRINGE KIT
PACK | SUBMUCOSAL | Status: AC
  Filled 2022-07-02: qty 5

## 2022-07-02 MED ORDER — PHENYLEPHRINE 80 MCG/ML (10ML) SYRINGE FOR IV PUSH (FOR BLOOD PRESSURE SUPPORT)
PREFILLED_SYRINGE | INTRAVENOUS | Status: AC
  Filled 2022-07-02: qty 10

## 2022-07-02 MED ORDER — PROPOFOL 10 MG/ML IV BOLUS
INTRAVENOUS | Status: DC | PRN
  Administered 2022-07-02 (×3): 20 mg via INTRAVENOUS
  Administered 2022-07-02: 30 mg via INTRAVENOUS
  Administered 2022-07-02: 20 mg via INTRAVENOUS

## 2022-07-02 MED ORDER — LIDOCAINE HCL (PF) 2 % IJ SOLN
INTRAMUSCULAR | Status: AC
  Filled 2022-07-02: qty 15

## 2022-07-02 MED ORDER — SODIUM CHLORIDE (PF) 0.9 % IJ SOLN
PREFILLED_SYRINGE | INTRAMUSCULAR | Status: DC | PRN
  Administered 2022-07-02: 1.5 mL

## 2022-07-02 MED ORDER — SPOT INK MARKER SYRINGE KIT
PACK | SUBMUCOSAL | Status: DC | PRN
  Administered 2022-07-02: .8 mL via SUBMUCOSAL

## 2022-07-02 MED ORDER — PROPOFOL 1000 MG/100ML IV EMUL
INTRAVENOUS | Status: AC
  Filled 2022-07-02: qty 200

## 2022-07-02 MED ORDER — PROPOFOL 10 MG/ML IV BOLUS
INTRAVENOUS | Status: AC
  Filled 2022-07-02: qty 20

## 2022-07-02 MED ORDER — EPINEPHRINE 1 MG/10ML IJ SOSY
PREFILLED_SYRINGE | INTRAMUSCULAR | Status: AC
  Filled 2022-07-02: qty 10

## 2022-07-02 MED ORDER — PROPOFOL 500 MG/50ML IV EMUL
INTRAVENOUS | Status: DC | PRN
  Administered 2022-07-02: 150 ug/kg/min via INTRAVENOUS

## 2022-07-02 NOTE — Op Note (Signed)
[facility] Patient Name: Alicia Fritz Procedure Date: 07/02/2022 12:49 PM MRN: [id number] Date of Birth: October 14, 1965 Attending MD: Maylon Peppers ,  CSN: [id number] Age: 56 Admit Type: Outpatient Procedure:                Colonoscopy Indications:              Rectal bleeding Providers:                Maylon Peppers, Crystal Page, Everardo Pacific Referring MD:              Medicines:                Monitored Anesthesia Care Complications:            No immediate complications. Estimated Blood Loss:     Estimated blood loss was minimal. Procedure:                Pre-Anesthesia Assessment:                           - Prior to the procedure, a History and Physical                            was performed, and patient medications, allergies                            and sensitivities were reviewed. The patient's                            tolerance of previous anesthesia was reviewed.                           - The risks and benefits of the procedure and the                            sedation options and risks were discussed with the                            patient. All questions were answered and informed                            consent was obtained.                           - ASA Grade Assessment: II - A patient with mild                            systemic disease.                           After obtaining informed consent, the colonoscope                            was passed under direct vision. Throughout the                            procedure, the patient's blood pressure, pulse, and  oxygen saturations were monitored continuously. The                            [id number]-HQ190L ([id number]) scope was introduced through                            the anus and advanced to the 10 cm into the ileum.                            The colonoscopy was performed without difficulty.                            The patient tolerated the procedure  well. The                            quality of the bowel preparation was good. Scope In: 1:01:33 PM Scope Out: 1:44:46 PM Scope Withdrawal Time: 0 hours 33 minutes 32 seconds  Total Procedure Duration: 0 hours 43 minutes 13 seconds  Findings:      The perianal and digital rectal examinations were normal.      The ileum, 10 cm from the ileocecal valve contained a single       medium-sized angiodysplastic lesion vs Dieulafoy lesion with bleeding.       Coagulation for hemostasis using argon plasma at 0.3 liters/minute and       20 watts was successful. To prevent bleeding post-intervention, two       hemostatic clips were successfully placed. There was no bleeding at the       end of the procedure. Area was successfully injected with 1.5 mL of a       1:10,000 solution of epinephrine for hemostasis. Area was tattooed with       an injection of 0.5 mL of Spot (carbon black).      The colon (entire examined portion) appeared normal.      The retroflexed view of the distal rectum and anal verge was normal and       showed no anal or rectal abnormalities. Impression:               - A single bleeding angiodysplastic lesion vs                            Dieulafoy in the ileum. Treated with argon plasma                            coagulation (APC). Clips were placed. Injected.                            Tattooed.                           - The entire examined colon is normal.                           - The distal rectum and anal verge are normal on  retroflexion view.                           - No specimens collected. Moderate Sedation:      Per Anesthesia Care Recommendation:           - Discharge patient to home (ambulatory).                           - Resume previous diet.                           - Repeat colonoscopy in 5 years for screening                            purposes. Procedure Code(s):        --- Professional ---                            [phone], Colonoscopy, flexible; with control of                            bleeding, any method                           [id number], 59, Colonoscopy, flexible; with directed                            submucosal injection(s), any substance Diagnosis Code(s):        --- Professional ---                           K55.21, Angiodysplasia of colon with hemorrhage                           K62.5, Hemorrhage of anus and rectum CPT copyright 2019 American Medical Association. All rights reserved. The codes documented in this report are preliminary and upon coder review may  be revised to meet current compliance requirements. Maylon Peppers, MD Maylon Peppers,  07/02/2022 1:51:59 PM This report has been signed electronically. Number of Addenda: 0

## 2022-07-02 NOTE — Discharge Instructions (Signed)
You are being discharged to home.  Resume your previous diet.  Your physician has recommended a repeat colonoscopy in five years for screening purposes.  

## 2022-07-02 NOTE — Transfer of Care (Signed)
Immediate Anesthesia Transfer of Care Note  Patient: Alicia Fritz  Procedure(s) Performed: COLONOSCOPY WITH PROPOFOL HOT HEMOSTASIS (ARGON PLASMA COAGULATION/BICAP) HEMOSTASIS CLIP PLACEMENT SUBMUCOSAL TATTOO INJECTION SUBMUCOSAL INJECTION  Patient Location: PACU  Anesthesia Type:MAC  Level of Consciousness: drowsy  Airway & Oxygen Therapy: Patient Spontanous Breathing and Patient connected to nasal cannula oxygen  Post-op Assessment: Report given to RN and Post -op Vital signs reviewed and stable  Post vital signs: Reviewed and stable  Last Vitals:  Vitals Value Taken Time  BP    Temp    Pulse    Resp    SpO2      Last Pain:  Vitals:   07/02/22 1257  TempSrc:   PainSc: 0-No pain         Complications: No notable events documented.

## 2022-07-02 NOTE — Anesthesia Preprocedure Evaluation (Addendum)
Anesthesia Evaluation  Patient identified by MRN, date of birth, ID band Patient awake    Reviewed: Allergy & Precautions, NPO status , Patient's Chart, lab work & pertinent test results  Airway Mallampati: II  TM Distance: >3 FB Neck ROM: Full    Dental  (+) Dental Advisory Given, Chipped, Loose,    Pulmonary pneumonia, Current Smoker,    Pulmonary exam normal breath sounds clear to auscultation       Cardiovascular hypertension, Pt. on medications Normal cardiovascular exam Rhythm:Regular Rate:Normal  14-May-2022 19:45:00 [place]-* ROUTINE RECORD 1966/07/20 (24 yr) Female Caucasian Vent. rate 81 BPM PR interval 116 ms QRS duration 90 ms QT/QTcB 380/441 ms P-R-T axes 74 80 75 Normal sinus rhythm Normal ECG   Neuro/Psych PSYCHIATRIC DISORDERS Anxiety Depression negative neurological ROS     GI/Hepatic Neg liver ROS, GERD  ,  Endo/Other  negative endocrine ROS  Renal/GU negative Renal ROS  negative genitourinary   Musculoskeletal  (+) Arthritis , Osteoarthritis,    Abdominal   Peds  (+) ADHD Hematology negative hematology ROS (+)   Anesthesia Other Findings Chronic back pain  Reproductive/Obstetrics negative OB ROS                            Anesthesia Physical Anesthesia Plan  ASA: 2  Anesthesia Plan: General   Post-op Pain Management: Minimal or no pain anticipated   Induction: Intravenous  PONV Risk Score and Plan: Propofol infusion  Airway Management Planned: Nasal Cannula and Natural Airway  Additional Equipment:   Intra-op Plan:   Post-operative Plan:   Informed Consent: I have reviewed the patients History and Physical, chart, labs and discussed the procedure including the risks, benefits and alternatives for the proposed anesthesia with the patient or authorized representative who has indicated his/her understanding and acceptance.      Dental advisory given  Plan Discussed with: CRNA and Surgeon  Anesthesia Plan Comments:         Anesthesia Quick Evaluation

## 2022-07-02 NOTE — H&P (Signed)
Alicia Fritz is an 56 y.o. female.   Chief Complaint: Rectal bleeding HPI: Alicia Fritz is a 56 y.o. female with past medical history of arthritis, back pain, GERD, HTN.,  Who comes to the hospital for evaluation of rectal bleeding.  She has had intermittent rectal bleeding for the last few years without abdominal pain, nausea, vomiting, fever, chills.  Family history remarkable for colon cancer, father had CRC at 23, mother had colon cancer at age 82  Past Medical History:  Diagnosis Date   ADHD (attention deficit hyperactivity disorder)    Allergy    Anxiety    Arthritis    Bronchitis    Chronic back pain    GERD (gastroesophageal reflux disease)    Hypertension    Night sweats    Pneumonia    Stress     Past Surgical History:  Procedure Laterality Date   ABDOMINAL HYSTERECTOMY     APPENDECTOMY     COLONOSCOPY N/A 03/02/2014   Procedure: COLONOSCOPY;  Surgeon: Rogene Houston, MD;  [facility];  Service: Endoscopy;  Laterality: N/A;  32   Left foot surgery  1993    Family History  Problem Relation Age of Onset   Cancer Mother        liver,lung   Diabetes Mother    Hypertension Mother    Colon cancer Mother    Diabetes Father    Colon cancer Father    Cirrhosis Father    Diabetes Sister    Hyperlipidemia Sister    Hypertension Maternal Grandmother    Mental illness Maternal Grandmother    Dementia Maternal Grandmother    Kidney disease Maternal Grandmother    Diabetes Maternal Grandfather    Mental illness Maternal Grandfather    Heart attack Paternal Grandmother    Hypertension Paternal Grandmother    Diabetes Paternal Grandmother    Cirrhosis Paternal Grandfather    Depression Sister    Hyperlipidemia Sister    Diabetes Sister    Social History:  reports that she has been smoking cigarettes. She has a 3.50 pack-year smoking history. She has never used smokeless tobacco. She reports current alcohol use. She reports that she does not  use drugs.  Allergies:  Allergies  Allergen Reactions   Penicillins     Pt unsure of reaction    Sulfa Drugs Cross Reactors Hives and Nausea And Vomiting    Medications Prior to Admission  Medication Sig Dispense Refill   ALPRAZolam (XANAX) 0.5 MG tablet Take 0.5 mg by mouth daily.     amphetamine-dextroamphetamine (ADDERALL) 30 MG tablet Take 30 mg by mouth daily.     B Complex Vitamins (B COMPLEX PO) Take 1 tablet by mouth daily.     cholecalciferol (VITAMIN D) 1000 units tablet Take 1,000 Units by mouth daily.     COLLAGEN-VITAMIN C PO Take 1 capsule by mouth daily.     DULoxetine (CYMBALTA) 60 MG capsule Take 60 mg by mouth daily.       Flaxseed, Linseed, (FLAXSEED OIL) 1200 MG CAPS Take 1,200 mg by mouth daily.     loratadine (CLARITIN) 10 MG tablet Take 10 mg by mouth daily as needed for allergies.     losartan (COZAAR) 100 MG tablet Take 100 mg by mouth daily.     polyethylene glycol-electrolytes (TRILYTE) 420 g solution Take 4,000 mLs by mouth as directed. 4000 mL 0    No results found for this or any previous visit (from the past  48 hour(s)). No results found.  Review of Systems  Constitutional: Negative.   HENT: Negative.    Eyes: Negative.   Respiratory: Negative.    Cardiovascular: Negative.   Gastrointestinal:  Positive for blood in stool.  Endocrine: Negative.   Genitourinary: Negative.   Musculoskeletal: Negative.   Skin: Negative.   Allergic/Immunologic: Negative.   Neurological: Negative.   Hematological: Negative.   Psychiatric/Behavioral: Negative.      Blood pressure 129/75, pulse 80, temperature 98.5 F (36.9 C), temperature source Oral, resp. rate 17, height '5\' 6"'$  (1.676 m), weight 65 kg, SpO2 (!) 20 %. Physical Exam  GENERAL: The patient is AO x3, in no acute distress. HEENT: Head is normocephalic and atraumatic. EOMI are intact. Mouth is well hydrated and without lesions. NECK: Supple. No masses LUNGS: Clear to auscultation. No presence of  rhonchi/wheezing/rales. Adequate chest expansion HEART: RRR, normal s1 and s2. ABDOMEN: Soft, nontender, no guarding, no peritoneal signs, and nondistended. BS +. No masses. EXTREMITIES: Without any cyanosis, clubbing, rash, lesions or edema. NEUROLOGIC: AOx3, no focal motor deficit. SKIN: no jaundice, no rashes  Assessment/Plan Alicia Fritz is a 56 y.o. female with past medical history of arthritis, back pain, GERD, HTN.,  Who comes to the hospital for evaluation of rectal bleeding.  We will proceed with colonoscopy. Harvel Quale, MD 07/02/2022, 12:55 PM

## 2022-07-02 NOTE — Anesthesia Postprocedure Evaluation (Signed)
Anesthesia Post Note  Patient: Alicia Fritz  Procedure(s) Performed: COLONOSCOPY WITH PROPOFOL HOT HEMOSTASIS (ARGON PLASMA COAGULATION/BICAP) HEMOSTASIS CLIP PLACEMENT SUBMUCOSAL TATTOO INJECTION SUBMUCOSAL INJECTION  Patient location during evaluation: Phase II Anesthesia Type: General Level of consciousness: awake and alert and oriented Pain management: pain level controlled Vital Signs Assessment: post-procedure vital signs reviewed and stable Respiratory status: spontaneous breathing, nonlabored ventilation and respiratory function stable Cardiovascular status: blood pressure returned to baseline and stable Postop Assessment: no apparent nausea or vomiting Anesthetic complications: no   No notable events documented.   Last Vitals:  Vitals:   07/02/22 1142 07/02/22 1348  BP: 129/75 111/77  Pulse: 80 76  Resp: 17 18  Temp: 36.9 C 36.9 C  SpO2: (!) 20% 99%    Last Pain:  Vitals:   07/02/22 1348  TempSrc: Oral  PainSc: 0-No pain                 Cristan Scherzer C Larkyn Greenberger

## 2022-07-02 NOTE — Anesthesia Procedure Notes (Signed)
Procedure Name: MAC Date/Time: 07/02/2022 1:02 PM  Performed by: Lieutenant Diego, CRNAPre-anesthesia Checklist: Patient identified, Emergency Drugs available, Suction available, Patient being monitored and Timeout performed Patient Re-evaluated:Patient Re-evaluated prior to induction Oxygen Delivery Method: Nasal cannula Preoxygenation: Pre-oxygenation with 100% oxygen Induction Type: IV induction

## 2022-07-08 ENCOUNTER — Encounter (HOSPITAL_COMMUNITY): Payer: Self-pay | Admitting: Gastroenterology

## 2022-07-12 ENCOUNTER — Encounter (INDEPENDENT_AMBULATORY_CARE_PROVIDER_SITE_OTHER): Payer: Self-pay | Admitting: *Deleted

## 2022-08-31 ENCOUNTER — Ambulatory Visit
Admission: EM | Admit: 2022-08-31 | Discharge: 2022-08-31 | Disposition: A | Discharge status: Home / Self Care | Payer: 59 | Attending: Nurse Practitioner | Admitting: Nurse Practitioner

## 2022-08-31 ENCOUNTER — Encounter: Payer: Self-pay | Admitting: Emergency Medicine

## 2022-08-31 DIAGNOSIS — Z1152 Encounter for screening for COVID-19: Secondary | ICD-10-CM | POA: Diagnosis not present

## 2022-08-31 DIAGNOSIS — B349 Viral infection, unspecified: Secondary | ICD-10-CM | POA: Insufficient documentation

## 2022-08-31 DIAGNOSIS — R6889 Other general symptoms and signs: Secondary | ICD-10-CM | POA: Diagnosis present

## 2022-08-31 LAB — RESP PANEL BY RT-PCR (FLU A&B, COVID) ARPGX2
Influenza A by PCR: POSITIVE — AB
Influenza B by PCR: NEGATIVE
SARS Coronavirus 2 by RT PCR: NEGATIVE

## 2022-08-31 MED ORDER — FLUTICASONE PROPIONATE 50 MCG/ACT NA SUSP: 2.0000 | Freq: Every day | NASAL | 0 refills | Status: DC

## 2022-08-31 MED ORDER — PSEUDOEPH-BROMPHEN-DM 30-2-10 MG/5ML PO SYRP: 5.0000 mL | ORAL_SOLUTION | Freq: Four times a day (QID) | ORAL | 0 refills | Status: DC | PRN

## 2022-08-31 NOTE — Discharge Instructions (Addendum)
COVID/flu test is pending. If the pending test are positive, you will be contacted. If the COVID test is positive, you are a candidate to receive molnupiravir if you chose to do so.  Take medication as prescribed. Increase fluids and allow for plenty of rest. Recommend Tylenol or ibuprofen as needed for pain, fever, or general discomfort. Warm salt water gargles 3-4 times daily to help with throat pain or discomfort. Recommend using a humidifier at bedtime during sleep to help with cough and nasal congestion and sleep elevated on 2 pillows. Viral illnesses can last from 10 to 14 days. If symptoms continue to persist or if you develop new symptoms, please follow-up with your primary care physician for further evaluation.

## 2022-08-31 NOTE — ED Provider Notes (Signed)
[facility]    CSN: [id number] Arrival date & time: 08/31/22  1230      History   Chief Complaint No chief complaint on file.   HPI Alicia Fritz is a 56 y.o. female.   The history is provided by the patient.   Patient presents for complaints of flulike symptoms that started over the past 24 hours.  Patient complains of body aches, headaches, chest congestion, and cough.  She denies fever, chills, ear pain, abdominal pain, nausea, vomiting, or diarrhea.  Patient reports she has been taking Mucinex and Tylenol for her symptoms.  She reports that she has received 2 COVID vaccines, but has not been vaccinated for influenza.  She denies any known sick contacts.  Past Medical History:  Diagnosis Date   ADHD (attention deficit hyperactivity disorder)    Allergy    Anxiety    Arthritis    Bronchitis    Chronic back pain    GERD (gastroesophageal reflux disease)    Hypertension    Night sweats    Pneumonia    Stress     Patient Active Problem List   Diagnosis Date Noted   Family history of colon cancer 05/16/2022   Constipation 05/16/2022   Rectal bleeding 05/16/2022   Smoker 09/19/2014   Depression 09/19/2014   H/O: hysterectomy 09/19/2014    Past Surgical History:  Procedure Laterality Date   ABDOMINAL HYSTERECTOMY     APPENDECTOMY     COLONOSCOPY N/A 03/02/2014   Procedure: COLONOSCOPY;  Surgeon: Rogene Houston, MD;  [facility];  Service: Endoscopy;  Laterality: N/A;  930   COLONOSCOPY WITH PROPOFOL N/A 07/02/2022   Procedure: COLONOSCOPY WITH PROPOFOL;  Surgeon: Harvel Quale, MD;  [facility];  Service: Gastroenterology;  Laterality: N/A;  215 ASA 2, pt knows new arrival time per [place]  07/02/2022   Procedure: HEMOSTASIS CLIP PLACEMENT;  Surgeon: Harvel Quale, MD;  [facility];  Service: Gastroenterology;;   HOT HEMOSTASIS  07/02/2022   Procedure: HOT  HEMOSTASIS (ARGON PLASMA COAGULATION/BICAP);  Surgeon: Montez Morita, Quillian Quince, MD;  [facility];  Service: Gastroenterology;;   Left foot surgery  [place]  07/02/2022   Procedure: SUBMUCOSAL INJECTION;  Surgeon: Harvel Quale, MD;  [facility];  Service: Gastroenterology;;  epinephrine    SUBMUCOSAL TATTOO INJECTION  07/02/2022   Procedure: SUBMUCOSAL TATTOO INJECTION;  Surgeon: Harvel Quale, MD;  [facility];  Service: Gastroenterology;;    OB History   No obstetric history on file.      Home Medications    Prior to Admission medications   Medication Sig Start Date End Date Taking? Authorizing Provider  brompheniramine-pseudoephedrine-DM 30-2-10 MG/5ML syrup Take 5 mLs by mouth 4 (four) times daily as needed. 08/31/22  Yes Connery Shiffler-Warren, Alda Lea, NP  fluticasone (FLONASE) 50 MCG/ACT nasal spray Place 2 sprays into both nostrils daily. 08/31/22  Yes Aadhya Bustamante-Warren, Alda Lea, NP  ALPRAZolam Duanne Moron) 0.5 MG tablet Take 0.5 mg by mouth daily.    [provider]  amphetamine-dextroamphetamine (ADDERALL) 30 MG tablet Take 30 mg by mouth daily.    [provider]  B Complex Vitamins (B COMPLEX PO) Take 1 tablet by mouth daily.    [provider]  cholecalciferol (VITAMIN D) 1000 units tablet Take 1,000 Units by mouth daily.    [provider]  COLLAGEN-VITAMIN C PO Take 1  capsule by mouth daily.    [provider]  DULoxetine (CYMBALTA) 60 MG capsule Take 60 mg by mouth daily.      [provider]  Flaxseed, Linseed, (FLAXSEED OIL) 1200 MG CAPS Take 1,200 mg by mouth daily.    [provider]  loratadine (CLARITIN) 10 MG tablet Take 10 mg by mouth daily as needed for allergies.    [provider]  losartan (COZAAR) 100 MG tablet Take 100 mg by mouth daily. 05/13/22   [provider]    Family History Family History  Problem  Relation Age of Onset   Cancer Mother        liver,lung   Diabetes Mother    Hypertension Mother    Colon cancer Mother    Diabetes Father    Colon cancer Father    Cirrhosis Father    Diabetes Sister    Hyperlipidemia Sister    Hypertension Maternal Grandmother    Mental illness Maternal Grandmother    Dementia Maternal Grandmother    Kidney disease Maternal Grandmother    Diabetes Maternal Grandfather    Mental illness Maternal Grandfather    Heart attack Paternal Grandmother    Hypertension Paternal Grandmother    Diabetes Paternal Grandmother    Cirrhosis Paternal Grandfather    Depression Sister    Hyperlipidemia Sister    Diabetes Sister     Social History Social History   Tobacco Use   Smoking status: Every Day    Packs/day: 0.50    Years: 7.00    Total pack years: 3.50    Types: Cigarettes   Smokeless tobacco: Never  Substance Use Topics   Alcohol use: Yes    Comment: glass of wine once a month   Drug use: No     Allergies   Penicillins and Sulfa drugs cross reactors   Review of Systems Review of Systems Per HPI  Physical Exam Triage Vital Signs ED Triage Vitals  Enc Vitals Group     BP 08/31/22 1326 129/78     Pulse Rate 08/31/22 1326 96     Resp 08/31/22 1326 18     Temp 08/31/22 1326 98.5 F (36.9 C)     Temp Source 08/31/22 1326 Oral     SpO2 08/31/22 1326 95 %     Weight --      Height --      Head Circumference --      Peak Flow --      Pain Score 08/31/22 1327 5     Pain Loc --      Pain Edu? --      Excl. in [place]? --    No data found.  Updated Vital Signs BP 129/78 (BP Location: Right Arm)   Pulse 96   Temp 98.5 F (36.9 C) (Oral)   Resp 18   SpO2 95%   Visual Acuity Right Eye Distance:   Left Eye Distance:   Bilateral Distance:    Right Eye Near:   Left Eye Near:    Bilateral Near:     Physical Exam Vitals and nursing note reviewed.  Constitutional:      General: She is not in acute distress.    Appearance:  Normal appearance.  HENT:     Head: Normocephalic.     Right Ear: Tympanic membrane, ear canal and external ear normal.     Left Ear: Tympanic membrane, ear canal and external ear normal.     Nose: Congestion  present.     Mouth/Throat:     Mouth: Mucous membranes are moist.     Pharynx: Posterior oropharyngeal erythema present.  Eyes:     Extraocular Movements: Extraocular movements intact.     Pupils: Pupils are equal, round, and reactive to light.  Cardiovascular:     Rate and Rhythm: Normal rate and regular rhythm.     Pulses: Normal pulses.     Heart sounds: Normal heart sounds.  Pulmonary:     Effort: Pulmonary effort is normal. No respiratory distress.     Breath sounds: Normal breath sounds. No stridor. No wheezing, rhonchi or rales.  Abdominal:     General: Bowel sounds are normal.     Palpations: Abdomen is soft.     Tenderness: There is no abdominal tenderness.  Musculoskeletal:     Cervical back: Normal range of motion.  Lymphadenopathy:     Cervical: No cervical adenopathy.  Skin:    General: Skin is warm and dry.  Neurological:     General: No focal deficit present.     Mental Status: She is alert and oriented to person, place, and time.  Psychiatric:        Mood and Affect: Mood normal.        Behavior: Behavior normal.      UC Treatments / Results  Labs (all labs ordered are listed, but only abnormal results are displayed) Labs Reviewed  RESP PANEL BY RT-PCR (FLU A&B, COVID) [id number]    EKG   Radiology No results found.  Procedures Procedures (including critical care time)  Medications Ordered in UC Medications - No data to display  Initial Impression / Assessment and Plan / UC Course  I have reviewed the triage vital signs and the nursing notes.  Pertinent labs & imaging results that were available during my care of the patient were reviewed by me and considered in my medical decision making (see chart for details).  The patient is  well-appearing, she is in no acute distress, vital signs are stable.  Suspect symptoms are consistent with a viral illness based on the patient's presentation.  COVID/flu test is pending.  Patient is a candidate to receive molnupiravir if her COVID test is positive.  Patient was prescribed Bromfed cough syrup and fluticasone 50 mcg nasal spray for her nasal congestion.  Supportive care recommendations were provided to the patient to include continuing Tylenol as needed for pain or discomfort, increasing fluids, and allowing for plenty of rest.  Patient verbalizes understanding.  All questions were answered.  Patient is stable for discharge. Final Clinical Impressions(s) / UC Diagnoses   Final diagnoses:  Flu-like symptoms  Viral illness     Discharge Instructions      COVID/flu test is pending. If the pending test are positive, you will be contacted. If the COVID test is positive, you are a candidate to receive molnupiravir if you chose to do so.  Take medication as prescribed. Increase fluids and allow for plenty of rest. Recommend Tylenol or ibuprofen as needed for pain, fever, or general discomfort. Warm salt water gargles 3-4 times daily to help with throat pain or discomfort. Recommend using a humidifier at bedtime during sleep to help with cough and nasal congestion and sleep elevated on 2 pillows. Viral illnesses can last from 10 to 14 days. If symptoms continue to persist or if you develop new symptoms, please follow-up with your primary care physician for further evaluation.      ED Prescriptions  Medication Sig Dispense Auth. Provider   brompheniramine-pseudoephedrine-DM 30-2-10 MG/5ML syrup Take 5 mLs by mouth 4 (four) times daily as needed. 140 mL Kalman Nylen-Warren, Alda Lea, NP   fluticasone (FLONASE) 50 MCG/ACT nasal spray Place 2 sprays into both nostrils daily. 16 g Jayci Ellefson-Warren, Alda Lea, NP      PDMP not reviewed this encounter.   Tish Men,  NP 08/31/22 1413

## 2022-08-31 NOTE — ED Triage Notes (Signed)
Body aches, headache, chest congestion, and cough since yesterday.  Has been taking mucinex and tylenol for symptoms.

## 2022-09-04 ENCOUNTER — Ambulatory Visit
Admission: EM | Admit: 2022-09-04 | Discharge: 2022-09-04 | Disposition: A | Discharge status: Home / Self Care | Payer: 59 | Attending: Nurse Practitioner | Admitting: Nurse Practitioner

## 2022-09-04 ENCOUNTER — Ambulatory Visit (INDEPENDENT_AMBULATORY_CARE_PROVIDER_SITE_OTHER): Payer: 59

## 2022-09-04 DIAGNOSIS — R079 Chest pain, unspecified: Secondary | ICD-10-CM

## 2022-09-04 DIAGNOSIS — R062 Wheezing: Secondary | ICD-10-CM | POA: Diagnosis not present

## 2022-09-04 DIAGNOSIS — J069 Acute upper respiratory infection, unspecified: Secondary | ICD-10-CM

## 2022-09-04 DIAGNOSIS — Z87891 Personal history of nicotine dependence: Secondary | ICD-10-CM | POA: Diagnosis not present

## 2022-09-04 DIAGNOSIS — R059 Cough, unspecified: Secondary | ICD-10-CM

## 2022-09-04 DIAGNOSIS — R0602 Shortness of breath: Secondary | ICD-10-CM | POA: Diagnosis not present

## 2022-09-04 MED ORDER — PREDNISONE 20 MG PO TABS: 40.0000 mg | ORAL_TABLET | Freq: Every day | ORAL | 0 refills | Status: AC

## 2022-09-04 MED ORDER — AZITHROMYCIN 250 MG PO TABS: ORAL_TABLET | ORAL | 0 refills | Status: DC

## 2022-09-04 MED ORDER — ALBUTEROL SULFATE (2.5 MG/3ML) 0.083% IN NEBU
2.5000 mg | INHALATION_SOLUTION | Freq: Once | RESPIRATORY_TRACT | Status: AC
  Administered 2022-09-04: 2.5 mg via RESPIRATORY_TRACT

## 2022-09-04 MED ORDER — ALBUTEROL SULFATE HFA 108 (90 BASE) MCG/ACT IN AERS: 1.0000 | INHALATION_SPRAY | Freq: Four times a day (QID) | RESPIRATORY_TRACT | 0 refills | Status: DC | PRN

## 2022-09-04 NOTE — ED Provider Notes (Signed)
[facility]    CSN: [id number] Arrival date & time: 09/04/22  1147      History   Chief Complaint Chief Complaint  Patient presents with   Shortness of Breath         HPI Alicia Fritz is a 56 y.o. female.   Patient presents with 5 days of bodyaches, chills, congested cough, shortness of breath, pain when breathing and pain in her chest from coughing, chest and nasal congestion, runny nose, sore throat, headache, nausea without vomiting, decreased appetite, and fatigue.  She denies ear pain or drainage, abdominal pain, vomiting, diarrhea, and loss of taste or smell.  reports she tested positive for the flu over the weekend.  Has taken over-the-counter cough medication which does not really help per her report.  Patient denies history of chronic lung disease.  Reports she smokes about 1/2 pack/day and has for the past 7 years.    Past Medical History:  Diagnosis Date   ADHD (attention deficit hyperactivity disorder)    Allergy    Anxiety    Arthritis    Bronchitis    Chronic back pain    GERD (gastroesophageal reflux disease)    Hypertension    Night sweats    Pneumonia    Stress     Patient Active Problem List   Diagnosis Date Noted   Family history of colon cancer 05/16/2022   Constipation 05/16/2022   Rectal bleeding 05/16/2022   Smoker 09/19/2014   Depression 09/19/2014   H/O: hysterectomy 09/19/2014    Past Surgical History:  Procedure Laterality Date   ABDOMINAL HYSTERECTOMY     APPENDECTOMY     COLONOSCOPY N/A 03/02/2014   Procedure: COLONOSCOPY;  Surgeon: Rogene Houston, MD;  [facility];  Service: Endoscopy;  Laterality: N/A;  930   COLONOSCOPY WITH PROPOFOL N/A 07/02/2022   Procedure: COLONOSCOPY WITH PROPOFOL;  Surgeon: Harvel Quale, MD;  [facility]: [facility];  Service: Gastroenterology;  Laterality: N/A;  215 ASA 2, pt knows new arrival time per [place]  07/02/2022    Procedure: HEMOSTASIS CLIP PLACEMENT;  Surgeon: Harvel Quale, MD;  [facility];  Service: Gastroenterology;;   HOT HEMOSTASIS  07/02/2022   Procedure: HOT HEMOSTASIS (ARGON PLASMA COAGULATION/BICAP);  Surgeon: Montez Morita, Quillian Quince, MD;  [facility];  Service: Gastroenterology;;   Left foot surgery  [place]  07/02/2022   Procedure: SUBMUCOSAL INJECTION;  Surgeon: Harvel Quale, MD;  [facility];  Service: Gastroenterology;;  epinephrine    SUBMUCOSAL TATTOO INJECTION  07/02/2022   Procedure: SUBMUCOSAL TATTOO INJECTION;  Surgeon: Harvel Quale, MD;  [facility];  Service: Gastroenterology;;    OB History   No obstetric history on file.      Home Medications    Prior to Admission medications   Medication Sig Start Date End Date Taking? Authorizing Provider  albuterol (VENTOLIN HFA) 108 (90 Base) MCG/ACT inhaler Inhale 1-2 puffs into the lungs every 6 (six) hours as needed for wheezing or shortness of breath. 09/04/22  Yes Eulogio Bear, NP  azithromycin (ZITHROMAX) 250 MG tablet Take (2) tablets by mouth on day 1, then take (1) tablet by mouth on days 2-5. 09/04/22  Yes Eulogio Bear, NP  predniSONE (DELTASONE) 20 MG tablet Take 2 tablets (40 mg total) by mouth daily with breakfast for 5 days. 09/04/22 09/09/22 Yes Eulogio Bear, NP  ALPRAZolam (XANAX) 0.5 MG tablet Take 0.5 mg by mouth daily.    [provider]  amphetamine-dextroamphetamine (ADDERALL) 30 MG tablet Take 30 mg by mouth daily.    [provider]  B Complex Vitamins (B COMPLEX PO) Take 1 tablet by mouth daily.    [provider]  brompheniramine-pseudoephedrine-DM 30-2-10 MG/5ML syrup Take 5 mLs by mouth 4 (four) times daily as needed. 08/31/22   Leath-Warren, Alda Lea, NP  cholecalciferol (VITAMIN D) 1000 units tablet Take 1,000 Units by mouth daily.    [provider]  COLLAGEN-VITAMIN C PO Take 1 capsule by mouth daily.    [provider]  DULoxetine (CYMBALTA) 60 MG capsule Take 60 mg by mouth daily.      [provider]  Flaxseed, Linseed, (FLAXSEED OIL) 1200 MG CAPS Take 1,200 mg by mouth daily.    [provider]  fluticasone (FLONASE) 50 MCG/ACT nasal spray Place 2 sprays into both nostrils daily. 08/31/22   Leath-Warren, Alda Lea, NP  loratadine (CLARITIN) 10 MG tablet Take 10 mg by mouth daily as needed for allergies.    [provider]  losartan (COZAAR) 100 MG tablet Take 100 mg by mouth daily. 05/13/22   [provider]    Family History Family History  Problem Relation Age of Onset   Cancer Mother        liver,lung   Diabetes Mother    Hypertension Mother    Colon cancer Mother    Diabetes Father    Colon cancer Father    Cirrhosis Father    Diabetes Sister    Hyperlipidemia Sister    Hypertension Maternal Grandmother    Mental illness Maternal Grandmother    Dementia Maternal Grandmother    Kidney disease Maternal Grandmother    Diabetes Maternal Grandfather    Mental illness Maternal Grandfather    Heart attack Paternal Grandmother    Hypertension Paternal Grandmother    Diabetes Paternal Grandmother    Cirrhosis Paternal Grandfather    Depression Sister    Hyperlipidemia Sister    Diabetes Sister     Social History Social History   Tobacco Use   Smoking status: Every Day    Packs/day: 0.50    Years: 7.00    Total pack years: 3.50    Types: Cigarettes   Smokeless tobacco: Never  Substance Use Topics   Alcohol use: Yes    Comment: glass of wine once a month   Drug use: No     Allergies   Penicillins and Sulfa drugs cross reactors   Review of Systems Review of Systems Per HPI  Physical Exam Triage Vital Signs ED Triage Vitals [09/04/22 1416]  Enc Vitals Group     BP (!) 144/85     Pulse Rate 87     Resp (!) 22     Temp 98 F (36.7 C)      Temp Source Oral     SpO2 94 %     Weight      Height      Head Circumference      Peak Flow      Pain Score 0     Pain Loc      Pain Edu?      Excl. in [place]?    No data found.  Updated Vital Signs BP (!) 144/85 (BP Location: Right Arm)   Pulse 87   Temp 98 F (36.7 C) (Oral)   Resp 20  SpO2 98%   Visual Acuity Right Eye Distance:   Left Eye Distance:   Bilateral Distance:    Right Eye Near:   Left Eye Near:    Bilateral Near:     Physical Exam Vitals and nursing note reviewed.  Constitutional:      General: She is not in acute distress.    Appearance: Normal appearance. She is not ill-appearing or toxic-appearing.  HENT:     Head: Normocephalic and atraumatic.     Right Ear: Tympanic membrane, ear canal and external ear normal.     Left Ear: Tympanic membrane, ear canal and external ear normal.     Nose: Congestion and rhinorrhea present.     Mouth/Throat:     Mouth: Mucous membranes are moist.     Pharynx: Oropharynx is clear. Posterior oropharyngeal erythema present. No oropharyngeal exudate.  Eyes:     General: No scleral icterus.    Extraocular Movements: Extraocular movements intact.  Cardiovascular:     Rate and Rhythm: Normal rate and regular rhythm.  Pulmonary:     Effort: Pulmonary effort is normal. No respiratory distress.     Breath sounds: Examination of the right-upper field reveals wheezing. Examination of the right-middle field reveals wheezing. Examination of the right-lower field reveals wheezing. Wheezing present. No rhonchi or rales.  Abdominal:     General: Abdomen is flat. Bowel sounds are normal. There is no distension.     Palpations: Abdomen is soft.  Musculoskeletal:     Cervical back: Normal range of motion and neck supple.  Lymphadenopathy:     Cervical: No cervical adenopathy.  Skin:    General: Skin is warm and dry.     Coloration: Skin is not jaundiced or pale.     Findings: No erythema or rash.  Neurological:     Mental  Status: She is alert and oriented to person, place, and time.  Psychiatric:        Behavior: Behavior is cooperative.      UC Treatments / Results  Labs (all labs ordered are listed, but only abnormal results are displayed) Labs Reviewed - No data to display  EKG   Radiology DG Chest 2 View  Result Date: 09/04/2022 CLINICAL DATA:  Cough for 5 days, chest pain, short of breath EXAM: CHEST - 2 VIEW COMPARISON:  None Available. FINDINGS: Frontal and lateral views of the chest demonstrate an unremarkable cardiac silhouette. No airspace disease, effusion, or pneumothorax. Background emphysema and parenchymal lung scarring consistent with history of tobacco abuse. No acute bony abnormalities. IMPRESSION: 1. No acute intrathoracic process. Electronically Signed   By: Randa Ngo M.D.   On: 09/04/2022 14:57    Procedures Procedures (including critical care time)  Medications Ordered in UC Medications  albuterol (PROVENTIL) (2.5 MG/3ML) 0.083% nebulizer solution 2.5 mg (2.5 mg Nebulization Given 09/04/22 1450)    Initial Impression / Assessment and Plan / UC Course  I have reviewed the triage vital signs and the nursing notes.  Pertinent labs & imaging results that were available during my care of the patient were reviewed by me and considered in my medical decision making (see chart for details).   Patient is well-appearing, normotensive, afebrile, not tachycardic, not tachypneic, oxygenating well on room air.    Viral URI with cough Discussed with patient that ongoing cough may last for a few weeks after viral upper respiratory infection Supportive care discussed  Wheezing Given wheezing on examination today, albuterol breathing treatment provided with significant improvement  in lung sounds bilaterally.  Wheezing increased to bilateral lobes in upper and lower lung fields Patient reported benefit of breathing treatment and oxygenation increased to 98% on room air Chest x-ray  today negative for acute cardiopulmonary process Recommended continuing albuterol inhaler at home every 4-6 hours as needed Start prednisone 40 mg daily for 5 days to treat breathing exacerbation Start azithromycin to cover for lung infection given smoking history Suspect underlying chronic bronchitis/emphysema and discussed this with patient Recommended close follow-up with PCP with no improvement in symptoms despite treatment  History of smoking for 6-10 years    The patient was given the opportunity to ask questions.  All questions answered to their satisfaction.  The patient is in agreement to this plan.    Final Clinical Impressions(s) / UC Diagnoses   Final diagnoses:  Viral URI with cough  Wheezing  History of smoking for 6-10 years     Discharge Instructions      The chest x-ray today is normal; no pneumonia  We have given you a breathing treatment today to help open up your airway.  Continue to use the albuterol inhaler every 4-6 hours as needed for wheezing or shortness of breath.  Start the prednisone 40 mg daily for 5 days.  Take the azithromycin as prescribed to cover for any bacteria in your lungs.   If your symptoms persist or worsen despite this treatment, follow up with your primary care provider.      ED Prescriptions     Medication Sig Dispense Auth. Provider   albuterol (VENTOLIN HFA) 108 (90 Base) MCG/ACT inhaler Inhale 1-2 puffs into the lungs every 6 (six) hours as needed for wheezing or shortness of breath. 18 g Noemi Chapel A, NP   predniSONE (DELTASONE) 20 MG tablet Take 2 tablets (40 mg total) by mouth daily with breakfast for 5 days. 10 tablet Noemi Chapel A, NP   azithromycin (ZITHROMAX) 250 MG tablet Take (2) tablets by mouth on day 1, then take (1) tablet by mouth on days 2-5. 6 tablet Eulogio Bear, NP      PDMP not reviewed this encounter.   Eulogio Bear, NP 09/04/22 1718

## 2022-09-04 NOTE — ED Triage Notes (Signed)
Pt reports back pain, chest congestion and shortness of breath since this morning; cough x 5 days. Reports she tested positive for Flu on 08/31/22.

## 2022-09-04 NOTE — Discharge Instructions (Addendum)
The chest x-ray today is normal; no pneumonia  We have given you a breathing treatment today to help open up your airway.  Continue to use the albuterol inhaler every 4-6 hours as needed for wheezing or shortness of breath.  Start the prednisone 40 mg daily for 5 days.  Take the azithromycin as prescribed to cover for any bacteria in your lungs.   If your symptoms persist or worsen despite this treatment, follow up with your primary care provider.

## 2023-04-14 DIAGNOSIS — I10 Essential (primary) hypertension: Secondary | ICD-10-CM | POA: Diagnosis not present

## 2023-04-14 DIAGNOSIS — F902 Attention-deficit hyperactivity disorder, combined type: Secondary | ICD-10-CM | POA: Diagnosis not present

## 2023-04-14 DIAGNOSIS — E785 Hyperlipidemia, unspecified: Secondary | ICD-10-CM | POA: Diagnosis not present

## 2023-04-14 DIAGNOSIS — M25511 Pain in right shoulder: Secondary | ICD-10-CM | POA: Diagnosis not present

## 2023-04-14 DIAGNOSIS — F418 Other specified anxiety disorders: Secondary | ICD-10-CM | POA: Diagnosis not present

## 2023-05-22 DIAGNOSIS — R110 Nausea: Secondary | ICD-10-CM | POA: Diagnosis not present

## 2023-05-22 DIAGNOSIS — J069 Acute upper respiratory infection, unspecified: Secondary | ICD-10-CM | POA: Diagnosis not present

## 2023-05-22 DIAGNOSIS — R42 Dizziness and giddiness: Secondary | ICD-10-CM | POA: Diagnosis not present

## 2023-06-02 DIAGNOSIS — K047 Periapical abscess without sinus: Secondary | ICD-10-CM | POA: Diagnosis not present

## 2023-06-02 DIAGNOSIS — M25511 Pain in right shoulder: Secondary | ICD-10-CM | POA: Diagnosis not present

## 2023-06-09 DIAGNOSIS — M25511 Pain in right shoulder: Secondary | ICD-10-CM | POA: Diagnosis not present

## 2023-07-10 DIAGNOSIS — H5213 Myopia, bilateral: Secondary | ICD-10-CM | POA: Diagnosis not present

## 2023-07-21 DIAGNOSIS — E785 Hyperlipidemia, unspecified: Secondary | ICD-10-CM | POA: Diagnosis not present

## 2023-07-21 DIAGNOSIS — F418 Other specified anxiety disorders: Secondary | ICD-10-CM | POA: Diagnosis not present

## 2023-07-21 DIAGNOSIS — I10 Essential (primary) hypertension: Secondary | ICD-10-CM | POA: Diagnosis not present

## 2023-07-21 DIAGNOSIS — M25511 Pain in right shoulder: Secondary | ICD-10-CM | POA: Diagnosis not present

## 2023-07-21 DIAGNOSIS — F902 Attention-deficit hyperactivity disorder, combined type: Secondary | ICD-10-CM | POA: Diagnosis not present

## 2023-09-05 DIAGNOSIS — R051 Acute cough: Secondary | ICD-10-CM | POA: Diagnosis not present

## 2023-09-05 DIAGNOSIS — J0100 Acute maxillary sinusitis, unspecified: Secondary | ICD-10-CM | POA: Diagnosis not present

## 2023-09-29 DIAGNOSIS — R059 Cough, unspecified: Secondary | ICD-10-CM | POA: Diagnosis not present

## 2023-09-29 DIAGNOSIS — J029 Acute pharyngitis, unspecified: Secondary | ICD-10-CM | POA: Diagnosis not present

## 2023-09-29 DIAGNOSIS — R509 Fever, unspecified: Secondary | ICD-10-CM | POA: Diagnosis not present

## 2024-01-05 DIAGNOSIS — M25511 Pain in right shoulder: Secondary | ICD-10-CM | POA: Diagnosis not present

## 2024-01-26 DIAGNOSIS — I10 Essential (primary) hypertension: Secondary | ICD-10-CM | POA: Diagnosis not present

## 2024-01-26 DIAGNOSIS — F418 Other specified anxiety disorders: Secondary | ICD-10-CM | POA: Diagnosis not present

## 2024-01-26 DIAGNOSIS — M25519 Pain in unspecified shoulder: Secondary | ICD-10-CM | POA: Diagnosis not present

## 2024-01-26 DIAGNOSIS — F902 Attention-deficit hyperactivity disorder, combined type: Secondary | ICD-10-CM | POA: Diagnosis not present

## 2024-01-26 DIAGNOSIS — E785 Hyperlipidemia, unspecified: Secondary | ICD-10-CM | POA: Diagnosis not present

## 2024-03-08 DIAGNOSIS — M7912 Myalgia of auxiliary muscles, head and neck: Secondary | ICD-10-CM | POA: Diagnosis not present

## 2024-03-08 DIAGNOSIS — M542 Cervicalgia: Secondary | ICD-10-CM | POA: Diagnosis not present

## 2024-03-08 DIAGNOSIS — G8929 Other chronic pain: Secondary | ICD-10-CM | POA: Diagnosis not present

## 2024-03-22 DIAGNOSIS — M25511 Pain in right shoulder: Secondary | ICD-10-CM | POA: Diagnosis not present

## 2024-03-22 DIAGNOSIS — M7912 Myalgia of auxiliary muscles, head and neck: Secondary | ICD-10-CM | POA: Diagnosis not present

## 2024-05-03 DIAGNOSIS — F418 Other specified anxiety disorders: Secondary | ICD-10-CM | POA: Diagnosis not present

## 2024-05-03 DIAGNOSIS — E785 Hyperlipidemia, unspecified: Secondary | ICD-10-CM | POA: Diagnosis not present

## 2024-05-03 DIAGNOSIS — M25519 Pain in unspecified shoulder: Secondary | ICD-10-CM | POA: Diagnosis not present

## 2024-05-03 DIAGNOSIS — F902 Attention-deficit hyperactivity disorder, combined type: Secondary | ICD-10-CM | POA: Diagnosis not present

## 2024-05-03 DIAGNOSIS — I10 Essential (primary) hypertension: Secondary | ICD-10-CM | POA: Diagnosis not present

## 2024-05-12 DIAGNOSIS — M25511 Pain in right shoulder: Secondary | ICD-10-CM | POA: Diagnosis not present

## 2024-05-31 DIAGNOSIS — J40 Bronchitis, not specified as acute or chronic: Secondary | ICD-10-CM | POA: Diagnosis not present

## 2024-06-01 DIAGNOSIS — M19011 Primary osteoarthritis, right shoulder: Secondary | ICD-10-CM | POA: Diagnosis not present

## 2024-06-01 DIAGNOSIS — M5030 Other cervical disc degeneration, unspecified cervical region: Secondary | ICD-10-CM | POA: Diagnosis not present

## 2024-06-09 DIAGNOSIS — J029 Acute pharyngitis, unspecified: Secondary | ICD-10-CM | POA: Diagnosis not present

## 2024-06-09 DIAGNOSIS — B370 Candidal stomatitis: Secondary | ICD-10-CM | POA: Diagnosis not present

## 2024-06-19 DIAGNOSIS — M25511 Pain in right shoulder: Secondary | ICD-10-CM | POA: Diagnosis not present

## 2024-07-07 ENCOUNTER — Encounter (INDEPENDENT_AMBULATORY_CARE_PROVIDER_SITE_OTHER): Payer: Self-pay | Admitting: Gastroenterology

## 2024-08-03 DIAGNOSIS — M25511 Pain in right shoulder: Secondary | ICD-10-CM | POA: Diagnosis not present

## 2024-08-03 DIAGNOSIS — M75121 Complete rotator cuff tear or rupture of right shoulder, not specified as traumatic: Secondary | ICD-10-CM | POA: Diagnosis not present

## 2024-08-03 DIAGNOSIS — M19011 Primary osteoarthritis, right shoulder: Secondary | ICD-10-CM | POA: Diagnosis not present

## 2024-08-09 DIAGNOSIS — E785 Hyperlipidemia, unspecified: Secondary | ICD-10-CM | POA: Diagnosis not present

## 2024-08-09 DIAGNOSIS — F902 Attention-deficit hyperactivity disorder, combined type: Secondary | ICD-10-CM | POA: Diagnosis not present

## 2024-08-09 DIAGNOSIS — I10 Essential (primary) hypertension: Secondary | ICD-10-CM | POA: Diagnosis not present

## 2024-08-09 DIAGNOSIS — M25519 Pain in unspecified shoulder: Secondary | ICD-10-CM | POA: Diagnosis not present

## 2024-08-09 DIAGNOSIS — F418 Other specified anxiety disorders: Secondary | ICD-10-CM | POA: Diagnosis not present

## 2024-09-03 NOTE — Patient Instructions (Signed)
 SURGICAL WAITING ROOM VISITATION  Patients having surgery or a procedure may have no more than 2 support people in the waiting area - these visitors may rotate.    Children ages 6 and under will not be able to visit patients in [facility] under most circumstances.   Visitors with respiratory illnesses are discouraged from visiting and should remain at home.  If the patient needs to stay at the hospital during part of their recovery, the visitor guidelines for inpatient rooms apply. Pre-op nurse will coordinate an appropriate time for 1 support person to accompany patient in pre-op.  This support person may not rotate.    Please refer to the [facility] website for the visitor guidelines for Inpatients (after your surgery is over and you are in a regular room).       Your procedure is scheduled on: 09/17/24   Report to [facility] Main Entrance    Report to admitting at 8:20 AM   Call this number if you have problems the morning of surgery [phone]   Do not eat food :After Midnight.   After Midnight you may have the following liquids until 7:30 AM DAY OF SURGERY  Water  Non-Citrus Juices (without pulp, NO RED-Apple, White grape, White cranberry) Black Coffee (NO MILK/CREAM OR CREAMERS, sugar ok)  Clear Tea (NO MILK/CREAM OR CREAMERS, sugar ok) regular and decaf                             Plain Jell-O (NO RED)                                           Fruit ices (not with fruit pulp, NO RED)                                     Popsicles (NO RED)                                                               Sports drinks like Gatorade (NO RED)                   The day of surgery:  Drink ONE (1) Pre-Surgery Clear Ensure at 7:30 AM the morning of surgery. Drink in one sitting. Do not sip.  This drink was given to you during your hospital  pre-op appointment visit. Nothing else to drink after completing the  Pre-Surgery Clear Ensure.  Oral Hygiene is  also important to reduce your risk of infection.                                    Remember - BRUSH YOUR TEETH THE MORNING OF SURGERY WITH YOUR REGULAR TOOTHPASTE   Do NOT smoke after Midnight   Stop all vitamins and herbal supplements 7 days before surgery.   Take these medicines the morning of surgery with A SIP OF WATER : Alprazolam, duloxetine  You may not have any metal on your body including hair pins, jewelry, and body piercing             Do not wear make-up, lotions, powders, perfumes/cologne, or deodorant  Do not wear nail polish including gel and S&S, artificial/acrylic nails, or any other type of covering on natural nails including finger and toenails. If you have artificial nails, gel coating, etc. that needs to be removed by a nail salon please have this removed prior to surgery or surgery may need to be canceled/ delayed if the surgeon/ anesthesia feels like they are unable to be safely monitored.   Do not shave  48 hours prior to surgery.             Do not bring valuables to the hospital. [place] IS NOT             RESPONSIBLE   FOR VALUABLES.   Contacts, glasses, dentures or bridgework may not be worn into surgery.   Bring small overnight bag day of surgery.   DO NOT BRING YOUR HOME MEDICATIONS TO THE HOSPITAL. PHARMACY WILL DISPENSE MEDICATIONS LISTED ON YOUR MEDICATION LIST TO YOU DURING YOUR ADMISSION IN THE HOSPITAL!    Patients discharged on the day of surgery will not be allowed to drive home.  Someone NEEDS to stay with you for the first 24 hours after anesthesia.   Special Instructions: Bring a copy of your healthcare power of attorney and living will documents the day of surgery if you haven't scanned them before.              Please read over the following fact sheets you were given: IF YOU HAVE QUESTIONS ABOUT YOUR PRE-OP INSTRUCTIONS PLEASE [phone] [place]   If you received a COVID test during your pre-op visit  it is  requested that you wear a mask when out in public, stay away from anyone that may not be feeling well and notify your surgeon if you develop symptoms. If you test positive for Covid or have been in contact with anyone that has tested positive in the last 10 days please notify you surgeon.    [facility] - Preparing for Surgery Before surgery, you can play an important role.  Because skin is not sterile, your skin needs to be as free of germs as possible.  You can reduce the number of germs on your skin by washing with CHG (chlorahexidine gluconate) soap before surgery.  CHG is an antiseptic cleaner which kills germs and bonds with the skin to continue killing germs even after washing. Please DO NOT use if you have an allergy to CHG or antibacterial soaps.  If your skin becomes reddened/irritated stop using the CHG and inform your nurse when you arrive at Short Stay. Do not shave (including legs and underarms) for at least 48 hours prior to the first CHG shower.  You may shave your face/neck.  Please follow these instructions carefully:  1.  Shower with CHG Soap the night before surgery ONLY (DO NOT USE THE SOAP THE MORNING OF SURGERY).  2.  If you choose to wash your hair, wash your hair first as usual with your normal  shampoo.  3.  After you shampoo, rinse your hair and body thoroughly to remove the shampoo.                             4.  Use CHG as  you would any other liquid soap.  You can apply chg directly to the skin and wash.  Gently with a scrungie or clean washcloth.  5.  Apply the CHG Soap to your body ONLY FROM THE NECK DOWN.   Do   not use on face/ open                           Wound or open sores. Avoid contact with eyes, ears mouth and   genitals (private parts).                       Wash face,  Genitals (private parts) with your normal soap.             6.  Wash thoroughly, paying special attention to the area where your    surgery  will be performed.  7.  Thoroughly rinse your body  with warm water  from the neck down.  8.  DO NOT shower/wash with your normal soap after using and rinsing off the CHG Soap.                9.  Pat yourself dry with a clean towel.            10.  Wear clean pajamas.            11.  Place clean sheets on your bed the night of your first shower and do not  sleep with pets. Day of Surgery : Do not apply any CHG, lotions/deodorants the morning of surgery.  Please wear clean clothes to the hospital/surgery center.  FAILURE TO FOLLOW THESE INSTRUCTIONS MAY RESULT IN THE CANCELLATION OF YOUR SURGERY  PATIENT SIGNATURE_________________________________  NURSE SIGNATURE__________________________________  ________________________________________________________________________Incentive [place] (Watch this video at home: [email or web address])  An incentive spirometer is a tool that can help keep your lungs clear and active. This tool measures how well you are filling your lungs with each breath. Taking long deep breaths may help reverse or decrease the chance of developing breathing (pulmonary) problems (especially infection) following: A long period of time when you are unable to move or be active. BEFORE THE PROCEDURE  If the spirometer includes an indicator to show your best effort, your nurse or respiratory therapist will set it to a desired goal. If possible, sit up straight or lean slightly forward. Try not to slouch. Hold the incentive spirometer in an upright position. INSTRUCTIONS FOR USE  Sit on the edge of your bed if possible, or sit up as far as you can in bed or on a chair. Hold the incentive spirometer in an upright position. Breathe out normally. Place the mouthpiece in your mouth and seal your lips tightly around it. Breathe in slowly and as deeply as possible, raising the piston or the ball toward the top of the column. Hold your breath for 3-5 seconds or for as long as possible. Allow the piston or ball  to fall to the bottom of the column. Remove the mouthpiece from your mouth and breathe out normally. Rest for a few seconds and repeat Steps 1 through 7 at least 10 times every 1-2 hours when you are awake. Take your time and take a few normal breaths between deep breaths. The spirometer may include an indicator to show your best effort. Use the indicator as a goal to work toward during each repetition. After each set of 10 deep breaths, practice coughing to be sure your  lungs are clear. If you have an incision (the cut made at the time of surgery), support your incision when coughing by placing a pillow or rolled up towels firmly against it. Once you are able to get out of bed, walk around indoors and cough well. You may stop using the incentive spirometer when instructed by your caregiver.  RISKS AND COMPLICATIONS Take your time so you do not get dizzy or light-headed. If you are in pain, you may need to take or ask for pain medication before doing incentive spirometry. It is harder to take a deep breath if you are having pain. AFTER USE Rest and breathe slowly and easily. It can be helpful to keep track of a log of your progress. Your caregiver can provide you with a simple table to help with this. If you are using the spirometer at home, follow these instructions: SEEK MEDICAL CARE IF:  You are having difficultly using the spirometer. You have trouble using the spirometer as often as instructed. Your pain medication is not giving enough relief while using the spirometer. You develop fever of 100.5 F (38.1 C) or higher. SEEK IMMEDIATE MEDICAL CARE IF:  You cough up bloody sputum that had not been present before. You develop fever of 102 F (38.9 C) or greater. You develop worsening pain at or near the incision site. MAKE SURE YOU:  Understand these instructions. Will watch your condition. Will get help right away if you are not doing well or get worse. Document Released: 01/20/2007  Document Revised: 12/02/2011 Document Reviewed: 03/23/2007 [facility] Patient Information 2014 [place], [place].

## 2024-09-03 NOTE — Progress Notes (Addendum)
 COVID Vaccine received:  []  No [x]  Yes Date of any COVID positive Test in last 90 days:  PCP - Dorothey Cassette NP Cardiologist -   Chest x-ray -  EKG -   Stress Test -  ECHO -  Cardiac Cath -   Bowel Prep - [x]  No  []   Yes ______  Pacemaker / ICD device [x]  No []  Yes   Spinal Cord Stimulator:[x]  No []  Yes       History of Sleep Apnea? [x]  No []  Yes   CPAP used?- [x]  No []  Yes    Does the patient monitor blood sugar?          [x]  No []  Yes  []  N/A  Patient has: [x]  NO Hx DM   []  Pre-DM                 []  DM1  []   DM2 Does patient have a [employer] or Dexacom? []  No []  Yes   Fasting Blood Sugar Ranges-  Checks Blood Sugar _____ times a day  GLP1 agonist / usual dose - no GLP1 instructions:  SGLT-2 inhibitors / usual dose - no SGLT-2 instructions:   Blood Thinner / Instructions:no Aspirin Instructions:no  Comments:   Activity level: Patient is able  to climb a flight of stairs without difficulty; [x]  No CP  [x]  No SOB,   Patient can perform ADLs without assistance.   Anesthesia review:   Patient denies shortness of breath, fever, cough and chest pain at PAT appointment.  Patient verbalized understanding and agreement to the Pre-Surgical Instructions that were given to them at this PAT appointment. Patient was also educated of the need to review these PAT instructions again prior to his/her surgery.I reviewed the appropriate phone numbers to call if they have any and questions or concerns.

## 2024-09-06 ENCOUNTER — Encounter (HOSPITAL_COMMUNITY): Payer: Self-pay | Admitting: Medical

## 2024-09-06 ENCOUNTER — Other Ambulatory Visit: Payer: Self-pay

## 2024-09-06 ENCOUNTER — Encounter (HOSPITAL_COMMUNITY): Payer: Self-pay

## 2024-09-06 ENCOUNTER — Encounter (HOSPITAL_COMMUNITY): Admission: RE | Admit: 2024-09-06 | Discharge: 2024-09-06 | Attending: Orthopedic Surgery

## 2024-09-06 VITALS — BP 146/71 | HR 90 | Resp 16 | Ht 66.0 in | Wt 141.0 lb

## 2024-09-06 DIAGNOSIS — Z01818 Encounter for other preprocedural examination: Secondary | ICD-10-CM | POA: Diagnosis not present

## 2024-09-06 DIAGNOSIS — I10 Essential (primary) hypertension: Secondary | ICD-10-CM | POA: Diagnosis not present

## 2024-09-06 HISTORY — DX: Dyspnea, unspecified: R06.00

## 2024-09-06 HISTORY — DX: Depression, unspecified: F32.A

## 2024-09-06 LAB — BASIC METABOLIC PANEL WITH GFR
Anion gap: 8 (ref 5–15)
BUN: 9 mg/dL (ref 6–20)
CO2: 28 mmol/L (ref 22–32)
Calcium: 9.5 mg/dL (ref 8.9–10.3)
Chloride: 107 mmol/L (ref 98–111)
Creatinine, Ser: 0.53 mg/dL (ref 0.44–1.00)
GFR, Estimated: >60 mL/min (ref >60)
Glucose, Bld: 106 mg/dL — ABNORMAL HIGH (ref 70–99)
Potassium: 4.2 mmol/L (ref 3.5–5.1)
Sodium: 142 mmol/L (ref 135–145)

## 2024-09-06 LAB — CBC
HCT: 41.9 % (ref 36.0–46.0)
Hemoglobin: 13.5 g/dL (ref 12.0–15.0)
MCH: 30.5 pg (ref 26.0–34.0)
MCHC: 32.2 g/dL (ref 30.0–36.0)
MCV: 94.8 fL (ref 80.0–100.0)
Platelets: 139 K/uL — ABNORMAL LOW (ref 150–400)
RBC: 4.42 MIL/uL (ref 3.87–5.11)
RDW: 12.7 % (ref 11.5–15.5)
WBC: 5.6 K/uL (ref 4.0–10.5)
nRBC: 0 % (ref 0.0–0.2)

## 2024-09-17 ENCOUNTER — Ambulatory Visit (HOSPITAL_COMMUNITY): Admission: RE | Admit: 2024-09-17 | Payer: Self-pay | Source: Ambulatory Visit | Admitting: Orthopedic Surgery

## 2024-09-17 ENCOUNTER — Encounter: Admission: RE | Payer: Self-pay

## 2024-09-17 SURGERY — TENODESIS, BICEPS
Anesthesia: Choice | Laterality: Right

## 2024-10-22 NOTE — Progress Notes (Signed)
 Sent message, via epic in basket, requesting orders in epic from Careers adviser.

## 2024-10-28 NOTE — Progress Notes (Signed)
 Date of COVID positive in last 90 days:  PCP - Dorothey Cassette, NP Cardiologist -   Chest x-ray - N/A EKG - 09/06/24 Epic Stress Test - N/A ECHO - N/A Cardiac Cath - N/A Pacemaker/ICD device last checked:N/A Spinal Cord Stimulator:N/A  Bowel Prep - N/A  Sleep Study - N/A CPAP -   Fasting Blood Sugar - N/A Checks Blood Sugar _____ times a day  Last dose of GLP1 agonist-  N/A GLP1 instructions:  Do not take after     Last dose of SGLT-2 inhibitors-  N/A SGLT-2 instructions:  Do not take after     Blood Thinner Instructions: N/A Last dose:   Time: Aspirin Instructions:N/A Last Dose:  Activity level: Can go up a flight of stairs and perform activities of daily living without stopping and without symptoms of chest pain or shortness of breath.   Anesthesia review: N/A  Patient denies shortness of breath, fever, cough and chest pain at PAT appointment  Patient verbalized understanding of instructions that were given to them at the PAT appointment. Patient was also instructed that they will need to review over the PAT instructions again at home before surgery.

## 2024-10-29 ENCOUNTER — Encounter (HOSPITAL_COMMUNITY): Admission: RE | Admit: 2024-10-29 | Source: Ambulatory Visit

## 2024-10-29 ENCOUNTER — Encounter (HOSPITAL_COMMUNITY): Payer: Self-pay

## 2024-10-29 ENCOUNTER — Other Ambulatory Visit: Payer: Self-pay

## 2024-10-29 VITALS — BP 161/90 | HR 82 | Temp 98.4°F | Resp 18 | Ht 66.0 in | Wt 141.0 lb

## 2024-10-29 DIAGNOSIS — I10 Essential (primary) hypertension: Secondary | ICD-10-CM

## 2024-10-29 HISTORY — DX: Other complications of anesthesia, initial encounter: T88.59XA

## 2024-10-29 HISTORY — DX: Chronic obstructive pulmonary disease, unspecified: J44.9

## 2024-10-29 LAB — CBC
HCT: 41.1 % (ref 36.0–46.0)
Hemoglobin: 13.3 g/dL (ref 12.0–15.0)
MCH: 31.4 pg (ref 26.0–34.0)
MCHC: 32.4 g/dL (ref 30.0–36.0)
MCV: 97.2 fL (ref 80.0–100.0)
Platelets: 252 10*3/uL (ref 150–400)
RBC: 4.23 MIL/uL (ref 3.87–5.11)
RDW: 12.7 % (ref 11.5–15.5)
WBC: 6 10*3/uL (ref 4.0–10.5)
nRBC: 0 % (ref 0.0–0.2)

## 2024-10-29 LAB — BASIC METABOLIC PANEL WITH GFR
Anion gap: 9 (ref 5–15)
BUN: 15 mg/dL (ref 6–20)
CO2: 26 mmol/L (ref 22–32)
Calcium: 9.5 mg/dL (ref 8.9–10.3)
Chloride: 107 mmol/L (ref 98–111)
Creatinine, Ser: 0.6 mg/dL (ref 0.44–1.00)
GFR, Estimated: >60 mL/min
Glucose, Bld: 100 mg/dL — ABNORMAL HIGH (ref 70–99)
Potassium: 4.4 mmol/L (ref 3.5–5.1)
Sodium: 141 mmol/L (ref 135–145)

## 2024-10-29 NOTE — Patient Instructions (Addendum)
 SURGICAL WAITING ROOM VISITATION  Patients having surgery or a procedure may have no more than 2 support people in the waiting area - these visitors may rotate.    Children ages 90 and under will not be able to visit patients in [facility] under most circumstances.   Visitors with respiratory illnesses are discouraged from visiting and should remain at home.  If the patient needs to stay at the hospital during part of their recovery, the visitor guidelines for inpatient rooms apply. Pre-op nurse will coordinate an appropriate time for 1 support person to accompany patient in pre-op.  This support person may not rotate.    Please refer to the [facility] website for the visitor guidelines for Inpatients (after your surgery is over and you are in a regular room).    Your procedure is scheduled on: 11/05/24   Report to [facility] Main Entrance    Report to admitting at 7:20 AM   Call this number if you have problems the morning of surgery [phone]   Do not eat food :After Midnight.   After Midnight you may have the following liquids until 6:35 AM DAY OF SURGERY  Water  Non-Citrus Juices (without pulp, NO RED-Apple, White grape, White cranberry) Black Coffee (NO MILK/CREAM OR CREAMERS, sugar ok)  Clear Tea (NO MILK/CREAM OR CREAMERS, sugar ok) regular and decaf                             Plain Jell-O (NO RED)                                           Fruit ices (not with fruit pulp, NO RED)                                     Popsicles (NO RED)                                                               Sports drinks like Gatorade (NO RED)               The day of surgery:  Drink ONE (1) Pre-Surgery Clear Ensure at 6:35 AM the morning of surgery. Drink in one sitting. Do not sip.  This drink was given to you during your hospital  pre-op appointment visit. Nothing else to drink after completing the  Pre-Surgery Clear Ensure.          If you have questions,  please contact your surgeons office.   FOLLOW BOWEL PREP AND ANY ADDITIONAL PRE OP INSTRUCTIONS YOU RECEIVED FROM YOUR SURGEON'S OFFICE!!!     Oral Hygiene is also important to reduce your risk of infection.                                    Remember - BRUSH YOUR TEETH THE MORNING OF SURGERY WITH YOUR REGULAR TOOTHPASTE  DENTURES WILL BE REMOVED PRIOR TO SURGERY PLEASE DO NOT APPLY Poly grip  OR ADHESIVES!!!   Do NOT smoke after Midnight   Stop all vitamins and herbal supplements 7 days before surgery.   Take these medicines the morning of surgery with A SIP OF WATER :  Alprazolam (Xanax) Duloxetine (Cymbalta) Tramadol (Ultram)                               You may not have any metal on your body including hair pins, jewelry, and body piercing             Do not wear make-up, lotions, powders, perfumes/cologne, or deodorant  Do not wear nail polish including gel and S&S, artificial/acrylic nails, or any other type of covering on natural nails including finger and toenails. If you have artificial nails, gel coating, etc. that needs to be removed by a nail salon please have this removed prior to surgery or surgery may need to be canceled/ delayed if the surgeon/ anesthesia feels like they are unable to be safely monitored.   Do not shave  48 hours prior to surgery.    Do not bring valuables to the hospital. [facility] IS NOT             RESPONSIBLE   FOR VALUABLES.   Contacts, glasses, dentures or bridgework may not be worn into surgery.  DO NOT BRING YOUR HOME MEDICATIONS TO THE HOSPITAL. PHARMACY WILL DISPENSE MEDICATIONS LISTED ON YOUR MEDICATION LIST TO YOU DURING YOUR ADMISSION IN THE HOSPITAL!    Patients discharged on the day of surgery will not be allowed to drive home.  Someone NEEDS to stay with you for the first 24 hours after anesthesia.              Please read over the following fact sheets you were given: IF YOU HAVE QUESTIONS ABOUT YOUR PRE-OP INSTRUCTIONS  PLEASE CALL [phone] [place].   If you received a COVID test during your pre-op visit  it is requested that you wear a mask when out in public, stay away from anyone that may not be feeling well and notify your surgeon if you develop symptoms. If you test positive for Covid or have been in contact with anyone that has tested positive in the last 10 days please notify you surgeon.    [facility] - Preparing for Surgery Before surgery, you can play an important role.  Because skin is not sterile, your skin needs to be as free of germs as possible.  You can reduce the number of germs on your skin by washing with CHG (chlorahexidine gluconate) soap before surgery.  CHG is an antiseptic cleaner which kills germs and bonds with the skin to continue killing germs even after washing. Please DO NOT use if you have an allergy to CHG or antibacterial soaps.  If your skin becomes reddened/irritated stop using the CHG and inform your nurse when you arrive at Short Stay. Do not shave (including legs and underarms) for at least 48 hours prior to the first CHG shower.  You may shave your face/neck.  Please follow these instructions carefully:  1.  Shower with CHG Soap the night before surgery ONLY (DO NOT USE THE SOAP THE MORNING OF SURGERY).  2.  If you choose to wash your hair, wash your hair first as usual with your normal  shampoo.  3.  After you shampoo, rinse your hair and body thoroughly to remove the shampoo.  4.  Use CHG as you would any other liquid soap.  You can apply chg directly to the skin and wash.  Gently with a scrungie or clean washcloth.  5.  Apply the CHG Soap to your body ONLY FROM THE NECK DOWN.   Do   not use on face/ open                           Wound or open sores. Avoid contact with eyes, ears mouth and   genitals (private parts).                       Wash face,  Genitals (private parts) with your normal soap.             6.  Wash thoroughly, paying  special attention to the area where your    surgery  will be performed.  7.  Thoroughly rinse your body with warm water  from the neck down.  8.  DO NOT shower/wash with your normal soap after using and rinsing off the CHG Soap.                9.  Pat yourself dry with a clean towel.            10.  Wear clean pajamas.            11.  Place clean sheets on your bed the night of your first shower and do not  sleep with pets. Day of Surgery : Do not apply any CHG, lotions/deodorants the morning of surgery.  Please wear clean clothes to the hospital/surgery center.  FAILURE TO FOLLOW THESE INSTRUCTIONS MAY RESULT IN THE CANCELLATION OF YOUR SURGERY  PATIENT SIGNATURE_________________________________  NURSE SIGNATURE__________________________________  ________________________________________________________________________  Nasario Exon  An incentive spirometer is a tool that can help keep your lungs clear and active. This tool measures how well you are filling your lungs with each breath. Taking long deep breaths may help reverse or decrease the chance of developing breathing (pulmonary) problems (especially infection) following: A long period of time when you are unable to move or be active. BEFORE THE PROCEDURE  If the spirometer includes an indicator to show your best effort, your nurse or respiratory therapist will set it to a desired goal. If possible, sit up straight or lean slightly forward. Try not to slouch. Hold the incentive spirometer in an upright position. INSTRUCTIONS FOR USE  Sit on the edge of your bed if possible, or sit up as far as you can in bed or on a chair. Hold the incentive spirometer in an upright position. Breathe out normally. Place the mouthpiece in your mouth and seal your lips tightly around it. Breathe in slowly and as deeply as possible, raising the piston or the ball toward the top of the column. Hold your breath for 3-5 seconds or for as long as  possible. Allow the piston or ball to fall to the bottom of the column. Remove the mouthpiece from your mouth and breathe out normally. Rest for a few seconds and repeat Steps 1 through 7 at least 10 times every 1-2 hours when you are awake. Take your time and take a few normal breaths between deep breaths. The spirometer may include an indicator to show your best effort. Use the indicator as a goal to work toward during each repetition. After each set of 10 deep breaths, practice coughing to be sure  your lungs are clear. If you have an incision (the cut made at the time of surgery), support your incision when coughing by placing a pillow or rolled up towels firmly against it. Once you are able to get out of bed, walk around indoors and cough well. You may stop using the incentive spirometer when instructed by your caregiver.  RISKS AND COMPLICATIONS Take your time so you do not get dizzy or light-headed. If you are in pain, you may need to take or ask for pain medication before doing incentive spirometry. It is harder to take a deep breath if you are having pain. AFTER USE Rest and breathe slowly and easily. It can be helpful to keep track of a log of your progress. Your caregiver can provide you with a simple table to help with this. If you are using the spirometer at home, follow these instructions: SEEK MEDICAL CARE IF:  You are having difficultly using the spirometer. You have trouble using the spirometer as often as instructed. Your pain medication is not giving enough relief while using the spirometer. You develop fever of 100.5 F (38.1 C) or higher. SEEK IMMEDIATE MEDICAL CARE IF:  You cough up bloody sputum that had not been present before. You develop fever of 102 F (38.9 C) or greater. You develop worsening pain at or near the incision site. MAKE SURE YOU:  Understand these instructions. Will watch your condition. Will get help right away if you are not doing well or get  worse. Document Released: 01/20/2007 Document Revised: 12/02/2011 Document Reviewed: 03/23/2007 [facility] Patient Information 2014 [place], [place].   ________________________________________________________________________

## 2024-11-05 ENCOUNTER — Encounter (HOSPITAL_COMMUNITY): Admission: RE | Payer: Self-pay | Source: Home / Self Care

## 2024-11-05 ENCOUNTER — Ambulatory Visit (HOSPITAL_COMMUNITY): Admit: 2024-11-05 | Admitting: Orthopedic Surgery
